# Patient Record
Sex: Female | Born: 1965 | Race: Black or African American | Hispanic: No | Marital: Single | Smoking: Former smoker
Health system: Southern US, Community
[De-identification: ages and names within clinical notes are randomized; demographics above are authoritative.]

## PROBLEM LIST (undated history)

## (undated) DIAGNOSIS — K5792 Diverticulitis of intestine, part unspecified, without perforation or abscess without bleeding: Secondary | ICD-10-CM

## (undated) DIAGNOSIS — M797 Fibromyalgia: Secondary | ICD-10-CM

## (undated) DIAGNOSIS — E079 Disorder of thyroid, unspecified: Secondary | ICD-10-CM

## (undated) DIAGNOSIS — Z21 Asymptomatic human immunodeficiency virus [HIV] infection status: Secondary | ICD-10-CM

## (undated) HISTORY — PX: OVARIAN CYST REMOVAL: SHX89

## (undated) HISTORY — PX: EYE SURGERY: SHX253

## (undated) HISTORY — PX: COLON SURGERY: SHX602

---

## 2012-09-07 DIAGNOSIS — B20 Human immunodeficiency virus [HIV] disease: Secondary | ICD-10-CM | POA: Diagnosis present

## 2013-03-12 DIAGNOSIS — E890 Postprocedural hypothyroidism: Secondary | ICD-10-CM | POA: Insufficient documentation

## 2013-03-12 DIAGNOSIS — Z87891 Personal history of nicotine dependence: Secondary | ICD-10-CM | POA: Insufficient documentation

## 2013-08-20 DIAGNOSIS — M7918 Myalgia, other site: Secondary | ICD-10-CM | POA: Insufficient documentation

## 2016-02-08 DIAGNOSIS — E039 Hypothyroidism, unspecified: Secondary | ICD-10-CM | POA: Insufficient documentation

## 2016-02-08 DIAGNOSIS — E785 Hyperlipidemia, unspecified: Secondary | ICD-10-CM | POA: Diagnosis present

## 2016-04-18 DIAGNOSIS — M797 Fibromyalgia: Secondary | ICD-10-CM | POA: Insufficient documentation

## 2018-09-21 DIAGNOSIS — F4310 Post-traumatic stress disorder, unspecified: Secondary | ICD-10-CM | POA: Insufficient documentation

## 2019-03-15 ENCOUNTER — Emergency Department (HOSPITAL_BASED_OUTPATIENT_CLINIC_OR_DEPARTMENT_OTHER)
Admission: EM | Admit: 2019-03-15 | Discharge: 2019-03-16 | Disposition: A | Discharge status: Home / Self Care | Payer: Medicare Other | Attending: Emergency Medicine | Admitting: Emergency Medicine

## 2019-03-15 ENCOUNTER — Other Ambulatory Visit: Payer: Self-pay

## 2019-03-15 DIAGNOSIS — Z79899 Other long term (current) drug therapy: Secondary | ICD-10-CM | POA: Diagnosis not present

## 2019-03-15 DIAGNOSIS — Y92012 Bathroom of single-family (private) house as the place of occurrence of the external cause: Secondary | ICD-10-CM | POA: Diagnosis not present

## 2019-03-15 DIAGNOSIS — Y9389 Activity, other specified: Secondary | ICD-10-CM | POA: Diagnosis not present

## 2019-03-15 DIAGNOSIS — Y998 Other external cause status: Secondary | ICD-10-CM | POA: Insufficient documentation

## 2019-03-15 DIAGNOSIS — W228XXA Striking against or struck by other objects, initial encounter: Secondary | ICD-10-CM | POA: Diagnosis not present

## 2019-03-15 DIAGNOSIS — S92512A Displaced fracture of proximal phalanx of left lesser toe(s), initial encounter for closed fracture: Secondary | ICD-10-CM | POA: Diagnosis not present

## 2019-03-15 DIAGNOSIS — S99922A Unspecified injury of left foot, initial encounter: Secondary | ICD-10-CM | POA: Diagnosis present

## 2019-03-15 HISTORY — DX: Diverticulitis of intestine, part unspecified, without perforation or abscess without bleeding: K57.92

## 2019-03-15 HISTORY — DX: Disorder of thyroid, unspecified: E07.9

## 2019-03-16 ENCOUNTER — Encounter (HOSPITAL_BASED_OUTPATIENT_CLINIC_OR_DEPARTMENT_OTHER): Payer: Self-pay | Admitting: *Deleted

## 2019-03-16 ENCOUNTER — Emergency Department (HOSPITAL_BASED_OUTPATIENT_CLINIC_OR_DEPARTMENT_OTHER): Payer: Medicare Other

## 2019-03-16 DIAGNOSIS — S92512A Displaced fracture of proximal phalanx of left lesser toe(s), initial encounter for closed fracture: Secondary | ICD-10-CM | POA: Diagnosis not present

## 2019-03-16 MED ORDER — DICLOFENAC SODIUM ER 100 MG PO TB24: 100.0000 mg | ORAL_TABLET | Freq: Every day | ORAL | 0 refills | Status: DC

## 2019-03-16 MED ORDER — IBUPROFEN 800 MG PO TABS
800.0000 mg | ORAL_TABLET | Freq: Once | ORAL | Status: AC
  Administered 2019-03-16: 800 mg via ORAL
  Filled 2019-03-16: qty 1

## 2019-03-16 MED ORDER — ACETAMINOPHEN 500 MG PO TABS
1000.0000 mg | ORAL_TABLET | Freq: Once | ORAL | Status: AC
  Administered 2019-03-16: 01:00:00 1000 mg via ORAL
  Filled 2019-03-16: qty 2

## 2019-03-16 NOTE — ED Notes (Signed)
Pt in XR. 

## 2019-03-16 NOTE — ED Triage Notes (Signed)
Pt reports she hit her left pinkie toe on the wall this evening and it bent the wrong way. Pt ambulatory with limp

## 2019-03-16 NOTE — ED Notes (Signed)
Patient transported to X-ray 

## 2019-03-16 NOTE — ED Provider Notes (Signed)
[facility] Provider Note   CSN: [id number] Arrival date & time: 03/15/19  2352     History Chief Complaint  Patient presents with  . Toe Injury    Stephanie Tyler is a 53 y.o. female.  The history is provided by the patient.  Foot Injury Location:  Toe Time since incident:  1 hour Injury: yes   Mechanism of injury comment:  Bumped left small toe on a knee wall in the bathroom  Toe location:  L little toe Pain details:    Quality:  Throbbing   Radiates to:  Does not radiate   Severity:  Severe   Onset quality:  Sudden   Duration:  1 hour   Timing:  Constant   Progression:  Unchanged Chronicity:  New Foreign body present:  No foreign bodies Prior injury to area: does not recall  Relieved by:  Nothing Worsened by:  Nothing Ineffective treatments:  None tried Associated symptoms: no back pain, no fever and no swelling   Risk factors: no concern for non-accidental trauma        Past Medical History:  Diagnosis Date  . Diverticulitis   . Thyroid disease     There are no problems to display for this patient.   Past Surgical History:  Procedure Laterality Date  . COLON SURGERY    . EYE SURGERY    . OVARIAN CYST REMOVAL       OB History   No obstetric history on file.     History reviewed. No pertinent family history.  Social History   Tobacco Use  . Smoking status: Former Research scientist (life sciences)  . Smokeless tobacco: Never Used  Substance Use Topics  . Alcohol use: Yes    Comment: rare  . Drug use: Not Currently    Home Medications Prior to Admission medications   Medication Sig Start Date End Date Taking? Authorizing Provider  levothyroxine (SYNTHROID) 100 MCG tablet Take 100 mcg by mouth daily before breakfast.   Yes [provider]  Diclofenac Sodium CR 100 MG 24 hr tablet Take 1 tablet (100 mg total) by mouth daily. 03/16/19   Cleto Claggett, MD    Allergies    Sulfamethoxazole and Sulfur  Review of Systems     Review of Systems  Constitutional: Negative for fever.  HENT: Negative for congestion.   Eyes: Negative for visual disturbance.  Respiratory: Negative for cough.   Cardiovascular: Negative for chest pain.  Gastrointestinal: Negative for abdominal pain.  Genitourinary: Negative for difficulty urinating.  Musculoskeletal: Positive for arthralgias. Negative for back pain.  Neurological: Negative for weakness.  Psychiatric/Behavioral: Negative for agitation.  All other systems reviewed and are negative.   Physical Exam Updated Vital Signs BP (!) 113/59 (BP Location: Right Arm)   Pulse (!) 57   Temp 98.2 F (36.8 C) (Oral)   Resp 19   Ht 5\' 1"  (1.549 m)   Wt 92.1 kg   SpO2 100%   BMI 38.36 kg/m   Physical Exam Vitals and nursing note reviewed.  Constitutional:      General: She is not in acute distress.    Appearance: Normal appearance.  HENT:     Head: Normocephalic and atraumatic.     Nose: Nose normal.  Eyes:     Conjunctiva/sclera: Conjunctivae normal.     Pupils: Pupils are equal, round, and reactive to light.  Cardiovascular:     Rate and Rhythm: Normal rate and regular rhythm.     Pulses: Normal  pulses.     Heart sounds: Normal heart sounds.  Pulmonary:     Effort: Pulmonary effort is normal.     Breath sounds: Normal breath sounds.  Abdominal:     General: Abdomen is flat.     Tenderness: There is no abdominal tenderness. There is no guarding.  Musculoskeletal:        General: Normal range of motion.     Cervical back: Normal range of motion and neck supple.  Skin:    General: Skin is warm and dry.     Capillary Refill: Capillary refill takes less than 2 seconds.  Neurological:     General: No focal deficit present.     Mental Status: She is alert and oriented to person, place, and time.  Psychiatric:        Mood and Affect: Mood normal.        Behavior: Behavior normal.     ED Results / Procedures / Treatments   Labs (all labs ordered are listed,  but only abnormal results are displayed) Labs Reviewed - No data to display  EKG None  Radiology DG Foot Complete Left  Result Date: 03/16/2019 CLINICAL DATA:  Fifth toe injury with pain, initial encounter EXAM: LEFT FOOT - COMPLETE 3+ VIEW COMPARISON:  None. FINDINGS: Prior fifth metatarsal fracture with healing is seen. There is a transverse mildly angulated fracture through the midportion of the fifth proximal phalanx with lateral angulation of the distal toe. No other focal abnormality is noted. IMPRESSION: Fifth proximal phalangeal fracture. Healed fifth metatarsal fracture. Electronically Signed   By: Alcide Clever M.D.   On: 03/16/2019 00:47    Procedures .Ortho Injury Treatment  Date/Time: 03/16/2019 5:11 AM Performed by: Cy Blamer, MD Authorized by: Cy Blamer, MD   Consent:    Consent obtained:  Verbal   Consent given by:  Patient   Risks discussed:  Fracture   Alternatives discussed:  No treatmentInjury location: toe Location details: left fifth toe Injury type: fracture Fracture type: proximal phalanx Pre-procedure neurovascular assessment: neurovascularly intact Pre-procedure distal perfusion: normal Pre-procedure neurological function: normal Pre-procedure range of motion: reduced  Anesthesia: Local anesthesia used: no  Patient sedated: NoManipulation performed: yes Skeletal traction used: yes Reduction successful: yes Immobilization: buddy taped and post op shoe applied. Post-procedure neurovascular assessment: post-procedure neurovascularly intact Post-procedure distal perfusion: normal Post-procedure neurological function: normal Post-procedure range of motion: normal Patient tolerance: patient tolerated the procedure well with no immediate complications    (including critical care time)  Medications Ordered in ED Medications  acetaminophen (TYLENOL) tablet 1,000 mg (1,000 mg Oral Given 03/16/19 0052)  ibuprofen (ADVIL) tablet 800 mg (800  mg Oral Given 03/16/19 0052)    ED Course  I have reviewed the triage vital signs and the nursing notes.  Pertinent labs & imaging results that were available during my care of the patient were reviewed by me and considered in my medical decision making (see chart for details).    Normal alignment post reduction, buddy taped and padded and placed in a post op shoe.  Ice elevation and NSAIDs.  Patient informed of old break in the foot.  She does not recall a previous injury.    Stephanie Tyler was evaluated in Emergency Department on 03/16/2019 for the symptoms described in the history of present illness. She was evaluated in the context of the global COVID-19 pandemic, which necessitated consideration that the patient might be at risk for infection with the SARS-CoV-2 virus that causes COVID-19. Institutional  protocols and algorithms that pertain to the evaluation of patients at risk for COVID-19 are in a state of rapid change based on information released by regulatory bodies including the CDC and federal and state organizations. These policies and algorithms were followed during the patient's care in the ED.   Final Clinical Impression(s) / ED Diagnoses Final diagnoses:  Closed displaced fracture of proximal phalanx of lesser toe of left foot, initial encounter   Return for intractable cough, coughing up blood,fevers >100.4 unrelieved by medication, shortness of breath, intractable vomiting, chest pain, shortness of breath, weakness,numbness, changes in speech, facial asymmetry,abdominal pain, passing out,Inability to tolerate liquids or food, cough, altered mental status or any concerns. No signs of systemic illness or infection. The patient is nontoxic-appearing on exam and vital signs are within normal limits.   I have reviewed the triage vital signs and the nursing notes. Pertinent labs &imaging results that were available during my care of the patient were reviewed by me and  considered in my medical decision making (see chart for details).  After history, exam, and medical workup I feel the patient has been appropriately medically screened and is safe for discharge home. Pertinent diagnoses were discussed with the patient. Patient was given return  Rx / DC Orders ED Discharge Orders         Ordered    Diclofenac Sodium CR 100 MG 24 hr tablet  Daily     03/16/19 0141           Nastashia Gallo, MD 03/16/19 [phone]

## 2019-05-28 DIAGNOSIS — H9042 Sensorineural hearing loss, unilateral, left ear, with unrestricted hearing on the contralateral side: Secondary | ICD-10-CM | POA: Insufficient documentation

## 2020-08-29 ENCOUNTER — Emergency Department (HOSPITAL_BASED_OUTPATIENT_CLINIC_OR_DEPARTMENT_OTHER)
Admission: EM | Admit: 2020-08-29 | Discharge: 2020-08-29 | Disposition: A | Discharge status: Home / Self Care | Payer: Medicare (Managed Care) | Attending: Emergency Medicine | Admitting: Emergency Medicine

## 2020-08-29 ENCOUNTER — Encounter (HOSPITAL_BASED_OUTPATIENT_CLINIC_OR_DEPARTMENT_OTHER): Payer: Self-pay | Admitting: Emergency Medicine

## 2020-08-29 ENCOUNTER — Other Ambulatory Visit: Payer: Self-pay

## 2020-08-29 DIAGNOSIS — Z20822 Contact with and (suspected) exposure to covid-19: Secondary | ICD-10-CM | POA: Diagnosis not present

## 2020-08-29 DIAGNOSIS — D72829 Elevated white blood cell count, unspecified: Secondary | ICD-10-CM | POA: Insufficient documentation

## 2020-08-29 DIAGNOSIS — Z87891 Personal history of nicotine dependence: Secondary | ICD-10-CM | POA: Diagnosis not present

## 2020-08-29 DIAGNOSIS — T383X5A Adverse effect of insulin and oral hypoglycemic [antidiabetic] drugs, initial encounter: Secondary | ICD-10-CM | POA: Diagnosis not present

## 2020-08-29 DIAGNOSIS — T50905A Adverse effect of unspecified drugs, medicaments and biological substances, initial encounter: Secondary | ICD-10-CM

## 2020-08-29 DIAGNOSIS — R1084 Generalized abdominal pain: Secondary | ICD-10-CM | POA: Diagnosis not present

## 2020-08-29 DIAGNOSIS — R112 Nausea with vomiting, unspecified: Secondary | ICD-10-CM | POA: Insufficient documentation

## 2020-08-29 DIAGNOSIS — R197 Diarrhea, unspecified: Secondary | ICD-10-CM | POA: Insufficient documentation

## 2020-08-29 LAB — COMPREHENSIVE METABOLIC PANEL
ALT: 18 U/L (ref 0–44)
AST: 24 U/L (ref 15–41)
Albumin: 4.6 g/dL (ref 3.5–5.0)
Alkaline Phosphatase: 92 U/L (ref 38–126)
Anion gap: 14 (ref 5–15)
BUN: 17 mg/dL (ref 6–20)
CO2: 25 mmol/L (ref 22–32)
Calcium: 10 mg/dL (ref 8.9–10.3)
Chloride: 100 mmol/L (ref 98–111)
Creatinine, Ser: 0.9 mg/dL (ref 0.44–1.00)
GFR, Estimated: >60 mL/min (ref >60)
Glucose, Bld: 113 mg/dL — ABNORMAL HIGH (ref 70–99)
Potassium: 3.8 mmol/L (ref 3.5–5.1)
Sodium: 139 mmol/L (ref 135–145)
Total Bilirubin: 0.5 mg/dL (ref 0.3–1.2)
Total Protein: 8.6 g/dL — ABNORMAL HIGH (ref 6.5–8.1)

## 2020-08-29 LAB — CBC
HCT: 47.7 % — ABNORMAL HIGH (ref 36.0–46.0)
Hemoglobin: 16.1 g/dL — ABNORMAL HIGH (ref 12.0–15.0)
MCH: 30.2 pg (ref 26.0–34.0)
MCHC: 33.8 g/dL (ref 30.0–36.0)
MCV: 89.5 fL (ref 80.0–100.0)
Platelets: 368 10*3/uL (ref 150–400)
RBC: 5.33 MIL/uL — ABNORMAL HIGH (ref 3.87–5.11)
RDW: 14.7 % (ref 11.5–15.5)
WBC: 15.4 10*3/uL — ABNORMAL HIGH (ref 4.0–10.5)
nRBC: 0 % (ref 0.0–0.2)

## 2020-08-29 LAB — URINALYSIS, ROUTINE W REFLEX MICROSCOPIC
Glucose, UA: NEGATIVE mg/dL
Ketones, ur: >80 mg/dL — AB
Leukocytes,Ua: NEGATIVE
Nitrite: NEGATIVE
Protein, ur: 30 mg/dL — AB
Specific Gravity, Urine: 1.025 (ref 1.005–1.030)
pH: 6.5 (ref 5.0–8.0)

## 2020-08-29 LAB — URINALYSIS, MICROSCOPIC (REFLEX)

## 2020-08-29 LAB — RESP PANEL BY RT-PCR (FLU A&B, COVID) ARPGX2
Influenza A by PCR: NEGATIVE
Influenza B by PCR: NEGATIVE
SARS Coronavirus 2 by RT PCR: NEGATIVE

## 2020-08-29 LAB — LIPASE, BLOOD: Lipase: 25 U/L (ref 11–51)

## 2020-08-29 MED ORDER — DICYCLOMINE HCL 20 MG PO TABS: 20.0000 mg | ORAL_TABLET | Freq: Two times a day (BID) | ORAL | 0 refills | Status: DC

## 2020-08-29 MED ORDER — SODIUM CHLORIDE 0.9 % IV BOLUS
1000.0000 mL | Freq: Once | INTRAVENOUS | Status: AC
  Administered 2020-08-29: 1000 mL via INTRAVENOUS

## 2020-08-29 MED ORDER — ONDANSETRON HCL 4 MG/2ML IJ SOLN
4.0000 mg | Freq: Once | INTRAMUSCULAR | Status: AC | PRN
  Administered 2020-08-29: 4 mg via INTRAVENOUS
  Filled 2020-08-29: qty 2

## 2020-08-29 MED ORDER — ONDANSETRON 4 MG PO TBDP: 4.0000 mg | ORAL_TABLET | Freq: Three times a day (TID) | ORAL | 0 refills | Status: AC | PRN

## 2020-08-29 NOTE — ED Provider Notes (Signed)
[facility] EMERGENCY DEPARTMENT Provider Note   CSN: [id number] Arrival date & time: 08/29/20  1541     History Chief Complaint  Patient presents with  . Vomiting    Stephanie Tyler is a 55 y.o. female who presents to the ED today with complaint of nausea, NBNB emesis, watery diarrhea, and abdominal pain that began Friday (2 days ago). Pt states that she received Ozempic injection Friday by PCP for weight loss/peripheral edema. She reports she went home and began eating grapes and shortly afterwards began vomiting. She has been unable to keep anything down since then. She began having watery diarrhea today causing concern. She also complains of her arms and legs hurting. She denies fevers or chills. No recent sick contacts. She is vaccinated for COVID.   The history is provided by the patient and medical records.       Past Medical History:  Diagnosis Date  . Diverticulitis   . Thyroid disease     There are no problems to display for this patient.   Past Surgical History:  Procedure Laterality Date  . COLON SURGERY    . EYE SURGERY    . OVARIAN CYST REMOVAL       OB History   No obstetric history on file.     No family history on file.  Social History   Tobacco Use  . Smoking status: Former Games developer  . Smokeless tobacco: Never Used  Vaping Use  . Vaping Use: Never used  Substance Use Topics  . Alcohol use: Yes    Comment: rare  . Drug use: Not Currently    Home Medications Prior to Admission medications   Medication Sig Start Date End Date Taking? Authorizing Provider  dicyclomine (BENTYL) 20 MG tablet Take 1 tablet (20 mg total) by mouth 2 (two) times daily. 08/29/20  Yes Gotti Alwin, PA-C  ondansetron (ZOFRAN ODT) 4 MG disintegrating tablet Take 1 tablet (4 mg total) by mouth every 8 (eight) hours as needed for nausea or vomiting. 08/29/20  Yes Hyman Hopes, Kailani Brass, PA-C  Diclofenac Sodium CR 100 MG 24 hr tablet Take 1 tablet (100 mg total) by  mouth daily. 03/16/19   Palumbo, April, MD  levothyroxine (SYNTHROID) 100 MCG tablet Take 100 mcg by mouth daily before breakfast.    [provider]    Allergies    Elemental sulfur and Sulfamethoxazole  Review of Systems   Review of Systems  Constitutional: Positive for fatigue. Negative for chills and fever.  Gastrointestinal: Positive for abdominal pain, diarrhea, nausea and vomiting.  Musculoskeletal: Positive for myalgias.  All other systems reviewed and are negative.   Physical Exam Updated Vital Signs BP (!) 187/94 (BP Location: Left Arm)   Pulse 77   Temp 98.6 F (37 C) (Oral)   Resp 18   Wt 92.1 kg   SpO2 100%   BMI 38.36 kg/m   Physical Exam Vitals and nursing note reviewed.  Constitutional:      Appearance: She is not ill-appearing or diaphoretic.  HENT:     Head: Normocephalic and atraumatic.     Mouth/Throat:     Mouth: Mucous membranes are dry.  Eyes:     Conjunctiva/sclera: Conjunctivae normal.  Cardiovascular:     Rate and Rhythm: Normal rate and regular rhythm.     Pulses: Normal pulses.  Pulmonary:     Effort: Pulmonary effort is normal.     Breath sounds: Normal breath sounds. No wheezing, rhonchi or rales.  Abdominal:  Palpations: Abdomen is soft.     Tenderness: There is abdominal tenderness. There is no guarding or rebound.  Musculoskeletal:     Cervical back: Neck supple.  Skin:    General: Skin is warm and dry.     Comments: Decreased skin turgor  Neurological:     Mental Status: She is alert.     ED Results / Procedures / Treatments   Labs (all labs ordered are listed, but only abnormal results are displayed) Labs Reviewed  COMPREHENSIVE METABOLIC PANEL - Abnormal; Notable for the following components:      Result Value   Glucose, Bld 113 (*)    Total Protein 8.6 (*)    All other components within normal limits  CBC - Abnormal; Notable for the following components:   WBC 15.4 (*)    RBC 5.33 (*)    Hemoglobin  16.1 (*)    HCT 47.7 (*)    All other components within normal limits  URINALYSIS, ROUTINE W REFLEX MICROSCOPIC - Abnormal; Notable for the following components:   APPearance HAZY (*)    Hgb urine dipstick SMALL (*)    Bilirubin Urine MODERATE (*)    Ketones, ur >80 (*)    Protein, ur 30 (*)    All other components within normal limits  URINALYSIS, MICROSCOPIC (REFLEX) - Abnormal; Notable for the following components:   Bacteria, UA FEW (*)    All other components within normal limits  RESP PANEL BY RT-PCR (FLU A&B, COVID) ARPGX2  LIPASE, BLOOD    EKG None  Radiology No results found.  Procedures Procedures   Medications Ordered in ED Medications  ondansetron (ZOFRAN) injection 4 mg (4 mg Intravenous Given 08/29/20 1606)  sodium chloride 0.9 % bolus 1,000 mL (0 mLs Intravenous Stopped 08/29/20 1805)    ED Course  I have reviewed the triage vital signs and the nursing notes.  Pertinent labs & imaging results that were available during my care of the patient were reviewed by me and considered in my medical decision making (see chart for details).    MDM Rules/Calculators/A&P                          55 year old female presenting to the ED with complaint of N/V/D, abdominal pain, and body aches for the past 2 days. She started taking Ozempic Friday for weight loss and feels like she is having an adverse rxn. On arrival to the ED VSS. Blood pressure elevated 187/94. No previous documented hx of HTN. On exam she has dry MM and has diffuse abdominal TTP without rebound or guarding; doubt acute abdomen at this time. Will provide fluids, antiemetics, and obtain labs. Will also swab for COVID given GI complaints with body aches. Per UTD myalgias do not appear to be a common side effect.   CBC with leukocytosis 15,400 however RBC, HGB, and HCT all elevated as well; suspect elevated s/2 dehydration.  CMP with glucose 113. No other electrolyte abnormalities. Creatinine 0.90, GFR 60. No  LFT elevations Lipase 25.  U/A with small hgb on urine dipstick however no RBC on HPF. Moderate bilirubin, > 80 ketones, and 30 protein consistent with dehydration COVID and flu negative  On reevaluation pt resting more comfortably. She reports improvement in her symptoms and wants to try to drink gingerale; will fluid challenge at this time and if able to tolerate PO will discharge home.   Pt able to tolerate gingerale without difficulty.  Will discharge home at this time. She is advised to follow up with her PCP on Tuesday regarding ED visit and to discontinue use of ozempic. Pt in agreement with plan and stable for discharge ome.   This note was prepared using Dragon voice recognition software and may include unintentional dictation errors due to the inherent limitations of voice recognition software.  Final Clinical Impression(s) / ED Diagnoses Final diagnoses:  Generalized abdominal pain  Nausea vomiting and diarrhea  Adverse effect of drug, initial encounter    Rx / DC Orders ED Discharge Orders         Ordered    ondansetron (ZOFRAN ODT) 4 MG disintegrating tablet  Every 8 hours PRN        08/29/20 1845    dicyclomine (BENTYL) 20 MG tablet  2 times daily        08/29/20 1845           Discharge Instructions     Please pick up medications and take as needed for symptomatic relief. Drink plenty of fluids to stay hydrated.   Follow up with your PCP on Tuesday regarding your ED visit. It is recommended that you discontinue the ozempic.   Return to the ED IMMEDIATELY for any new/worsening symptoms       Tanda Rockers, PA-C 08/29/20 1849    Virgina Norfolk, DO 08/29/20 1946

## 2020-08-29 NOTE — ED Triage Notes (Signed)
Reports she was started on ozempic on Friday.  Having N/V/D since also reports having body cramping.

## 2020-08-29 NOTE — ED Notes (Signed)
Pt tolerating ginger ale without nausea or vomiting. States feels much better

## 2020-08-29 NOTE — ED Notes (Signed)
Pt provided with gingerale for PO challenge  

## 2020-08-29 NOTE — Discharge Instructions (Signed)
Please pick up medications and take as needed for symptomatic relief. Drink plenty of fluids to stay hydrated.   Follow up with your PCP on Tuesday regarding your ED visit. It is recommended that you discontinue the ozempic.   Return to the ED IMMEDIATELY for any new/worsening symptoms

## 2020-12-09 DIAGNOSIS — F1290 Cannabis use, unspecified, uncomplicated: Secondary | ICD-10-CM | POA: Insufficient documentation

## 2020-12-17 DIAGNOSIS — G4733 Obstructive sleep apnea (adult) (pediatric): Secondary | ICD-10-CM

## 2021-01-30 ENCOUNTER — Other Ambulatory Visit: Payer: Self-pay

## 2021-01-30 ENCOUNTER — Emergency Department (HOSPITAL_BASED_OUTPATIENT_CLINIC_OR_DEPARTMENT_OTHER): Payer: Medicare (Managed Care)

## 2021-01-30 ENCOUNTER — Emergency Department (HOSPITAL_BASED_OUTPATIENT_CLINIC_OR_DEPARTMENT_OTHER)
Admission: EM | Admit: 2021-01-30 | Discharge: 2021-01-30 | Disposition: A | Discharge status: Home / Self Care | Payer: Medicare (Managed Care) | Attending: Emergency Medicine | Admitting: Emergency Medicine

## 2021-01-30 ENCOUNTER — Encounter (HOSPITAL_BASED_OUTPATIENT_CLINIC_OR_DEPARTMENT_OTHER): Payer: Self-pay | Admitting: *Deleted

## 2021-01-30 DIAGNOSIS — S93402A Sprain of unspecified ligament of left ankle, initial encounter: Secondary | ICD-10-CM | POA: Diagnosis not present

## 2021-01-30 DIAGNOSIS — Y92410 Unspecified street and highway as the place of occurrence of the external cause: Secondary | ICD-10-CM | POA: Diagnosis not present

## 2021-01-30 DIAGNOSIS — X501XXA Overexertion from prolonged static or awkward postures, initial encounter: Secondary | ICD-10-CM | POA: Insufficient documentation

## 2021-01-30 DIAGNOSIS — M25572 Pain in left ankle and joints of left foot: Secondary | ICD-10-CM | POA: Diagnosis not present

## 2021-01-30 DIAGNOSIS — Z87891 Personal history of nicotine dependence: Secondary | ICD-10-CM | POA: Diagnosis not present

## 2021-01-30 DIAGNOSIS — S99912A Unspecified injury of left ankle, initial encounter: Secondary | ICD-10-CM | POA: Diagnosis present

## 2021-01-30 MED ORDER — OXYCODONE-ACETAMINOPHEN 5-325 MG PO TABS
1.0000 | ORAL_TABLET | Freq: Once | ORAL | Status: AC
  Administered 2021-01-30: 1 via ORAL
  Filled 2021-01-30: qty 1

## 2021-01-30 NOTE — ED Triage Notes (Signed)
Pt reports twisted ankle 2 days ago walking on uneven ground

## 2021-01-30 NOTE — Discharge Instructions (Signed)
Follow-up with orthopedic or sports medicine next week.  Recommend bearing weight as tolerated.  Take Tylenol and Motrin as needed for pain control.

## 2021-01-30 NOTE — ED Provider Notes (Signed)
[facility] EMERGENCY DEPARTMENT Provider Note   CSN: [id number] Arrival date & time: 01/30/21  1522     History Chief Complaint  Patient presents with   Foot Pain    Donzella Carrol is a 55 y.o. female.  Presented to ER with concern for foot and ankle pain.  Patient reports on Friday she twisted her left ankle while going over some asphalt.  States that she has felt a sudden amount of pain, since  her to the ground.  Denies any other major trauma.  Pain is aching, throbbing, worse with movement, improved with rest.  No numbness, weakness, noted some swelling but no redness or ecchymosis.  Has been able to bear weight.  With some difficulty  HPI     Past Medical History:  Diagnosis Date   Diverticulitis    Thyroid disease     There are no problems to display for this patient.   Past Surgical History:  Procedure Laterality Date   COLON SURGERY     EYE SURGERY     OVARIAN CYST REMOVAL       OB History   No obstetric history on file.     No family history on file.  Social History   Tobacco Use   Smoking status: Former   Smokeless tobacco: Never  Building services engineer Use: Never used  Substance Use Topics   Alcohol use: Yes    Comment: rare   Drug use: Not Currently    Home Medications Prior to Admission medications   Medication Sig Start Date End Date Taking? Authorizing Provider  Diclofenac Sodium CR 100 MG 24 hr tablet Take 1 tablet (100 mg total) by mouth daily. 03/16/19   Palumbo, April, MD  dicyclomine (BENTYL) 20 MG tablet Take 1 tablet (20 mg total) by mouth 2 (two) times daily. 08/29/20   Tanda Rockers, PA-C  levothyroxine (SYNTHROID) 100 MCG tablet Take 100 mcg by mouth daily before breakfast.    [provider]  ondansetron (ZOFRAN ODT) 4 MG disintegrating tablet Take 1 tablet (4 mg total) by mouth every 8 (eight) hours as needed for nausea or vomiting. 08/29/20   Tanda Rockers, PA-C    Allergies    Elemental sulfur and  Sulfamethoxazole  Review of Systems   Review of Systems  Musculoskeletal:  Positive for arthralgias.  All other systems reviewed and are negative.  Physical Exam Updated Vital Signs BP (!) 155/99 (BP Location: Left Arm)   Pulse 75   Temp 98.8 F (37.1 C) (Oral)   Resp 18   SpO2 91%   Physical Exam Vitals and nursing note reviewed.  Constitutional:      General: She is not in acute distress.    Appearance: She is well-developed.  HENT:     Head: Normocephalic and atraumatic.  Eyes:     Conjunctiva/sclera: Conjunctivae normal.  Cardiovascular:     Rate and Rhythm: Normal rate.     Pulses: Normal pulses.  Pulmonary:     Effort: Pulmonary effort is normal. No respiratory distress.  Musculoskeletal:     Cervical back: Neck supple.     Comments: Left lower extremity: There is tenderness to palpation in the left ankle, hindfoot, no significant deformity appreciated; normal sensation, normal DP/PT pulse  Skin:    General: Skin is warm and dry.  Neurological:     General: No focal deficit present.     Mental Status: She is alert.  Psychiatric:  Mood and Affect: Mood normal.    ED Results / Procedures / Treatments   Labs (all labs ordered are listed, but only abnormal results are displayed) Labs Reviewed - No data to display  EKG None  Radiology DG Ankle Complete Left  Result Date: 01/30/2021 CLINICAL DATA:  Twisted foot and ankle walking outside 2 days ago. EXAM: LEFT ANKLE COMPLETE - 3+ VIEW COMPARISON:  None. FINDINGS: No fracture. No dislocation. Normal mortise and ankle alignment. May be a small ankle joint effusion. Intact talar dome. There is a small plantar calcaneal spur and Achilles tendon enthesophyte. IMPRESSION: No acute fracture or subluxation. Possible small joint effusion. Electronically Signed   By: Narda Rutherford M.D.   On: 01/30/2021 16:25   DG Foot Complete Left  Result Date: 01/30/2021 CLINICAL DATA:  Twisted foot and ankle walking outside  2 days ago. EXAM: LEFT FOOT - COMPLETE 3+ VIEW COMPARISON:  Foot radiograph 03/16/2019 FINDINGS: No acute fracture. Remote healed fracture of the fifth metatarsal shaft and fifth proximal phalanx. Normal foot alignment. Trace spurring of the first metatarsal phalangeal joint and dorsal midfoot. Small plantar calcaneal spur and Achilles tendon enthesophyte. No focal soft tissue abnormality. IMPRESSION: 1. No acute fracture or subluxation of the left foot. 2. Remote healed fifth metatarsal and proximal phalanx fractures. Electronically Signed   By: Narda Rutherford M.D.   On: 01/30/2021 16:26    Procedures Procedures   Medications Ordered in ED Medications  oxyCODONE-acetaminophen (PERCOCET/ROXICET) 5-325 MG per tablet 1 tablet (1 tablet Oral Given 01/30/21 1712)    ED Course  I have reviewed the triage vital signs and the nursing notes.  Pertinent labs & imaging results that were available during my care of the patient were reviewed by me and considered in my medical decision making (see chart for details).    MDM Rules/Calculators/A&P                           55 year old lady presents to ER with concern for left ankle injury.  On exam noted some tenderness to the ankle but no significant deformity.  Neurovascular intact.  Plain films negative.  Recommend supportive care, follow-up with Ortho or sports medicine as needed if symptoms not improving.  After the discussed management above, the patient was determined to be safe for discharge.  The patient was in agreement with this plan and all questions regarding their care were answered.  ED return precautions were discussed and the patient will return to the ED with any significant worsening of condition.  Final Clinical Impression(s) / ED Diagnoses Final diagnoses:  Sprain of left ankle, unspecified ligament, initial encounter    Rx / DC Orders ED Discharge Orders     None        Milagros Loll, MD 01/30/21 [phone]

## 2021-03-11 ENCOUNTER — Other Ambulatory Visit (HOSPITAL_BASED_OUTPATIENT_CLINIC_OR_DEPARTMENT_OTHER): Payer: Self-pay

## 2021-03-11 MED ORDER — OZEMPIC (0.25 OR 0.5 MG/DOSE) 2 MG/1.5ML ~~LOC~~ SOPN
PEN_INJECTOR | SUBCUTANEOUS | 3 refills | Status: DC
  Filled 2021-03-11: qty 1.5, 28d supply, fill #0
  Filled 2021-04-08: qty 4.5, 84d supply, fill #1

## 2021-03-29 ENCOUNTER — Other Ambulatory Visit (HOSPITAL_COMMUNITY): Payer: Self-pay

## 2021-04-08 ENCOUNTER — Other Ambulatory Visit (HOSPITAL_BASED_OUTPATIENT_CLINIC_OR_DEPARTMENT_OTHER): Payer: Self-pay

## 2021-05-26 ENCOUNTER — Other Ambulatory Visit (HOSPITAL_COMMUNITY): Payer: Self-pay

## 2021-05-31 ENCOUNTER — Other Ambulatory Visit (HOSPITAL_COMMUNITY): Payer: Self-pay

## 2021-08-09 ENCOUNTER — Other Ambulatory Visit (HOSPITAL_BASED_OUTPATIENT_CLINIC_OR_DEPARTMENT_OTHER): Payer: Self-pay

## 2021-08-09 MED ORDER — OZEMPIC (1 MG/DOSE) 4 MG/3ML ~~LOC~~ SOPN
1.0000 mg | PEN_INJECTOR | SUBCUTANEOUS | 2 refills | Status: DC
  Filled 2021-08-09: qty 3, 28d supply, fill #0

## 2021-08-17 ENCOUNTER — Other Ambulatory Visit (HOSPITAL_BASED_OUTPATIENT_CLINIC_OR_DEPARTMENT_OTHER): Payer: Self-pay

## 2021-10-19 DIAGNOSIS — M5412 Radiculopathy, cervical region: Secondary | ICD-10-CM | POA: Insufficient documentation

## 2023-01-31 DIAGNOSIS — L439 Lichen planus, unspecified: Secondary | ICD-10-CM | POA: Insufficient documentation

## 2023-03-23 ENCOUNTER — Encounter (HOSPITAL_BASED_OUTPATIENT_CLINIC_OR_DEPARTMENT_OTHER): Payer: Self-pay

## 2023-03-23 ENCOUNTER — Emergency Department (HOSPITAL_COMMUNITY): Payer: 59

## 2023-03-23 ENCOUNTER — Observation Stay (HOSPITAL_BASED_OUTPATIENT_CLINIC_OR_DEPARTMENT_OTHER)
Admission: EM | Admit: 2023-03-23 | Discharge: 2023-03-25 | Disposition: A | Discharge status: Home / Self Care | Payer: 59 | Attending: Student in an Organized Health Care Education/Training Program | Admitting: Student in an Organized Health Care Education/Training Program

## 2023-03-23 ENCOUNTER — Emergency Department (HOSPITAL_BASED_OUTPATIENT_CLINIC_OR_DEPARTMENT_OTHER): Payer: 59

## 2023-03-23 ENCOUNTER — Other Ambulatory Visit: Payer: Self-pay

## 2023-03-23 DIAGNOSIS — Z7901 Long term (current) use of anticoagulants: Secondary | ICD-10-CM | POA: Insufficient documentation

## 2023-03-23 DIAGNOSIS — L432 Lichenoid drug reaction: Secondary | ICD-10-CM | POA: Diagnosis not present

## 2023-03-23 DIAGNOSIS — I5020 Unspecified systolic (congestive) heart failure: Secondary | ICD-10-CM | POA: Insufficient documentation

## 2023-03-23 DIAGNOSIS — I10 Essential (primary) hypertension: Secondary | ICD-10-CM | POA: Diagnosis present

## 2023-03-23 DIAGNOSIS — G4733 Obstructive sleep apnea (adult) (pediatric): Secondary | ICD-10-CM | POA: Insufficient documentation

## 2023-03-23 DIAGNOSIS — Z79899 Other long term (current) drug therapy: Secondary | ICD-10-CM | POA: Diagnosis not present

## 2023-03-23 DIAGNOSIS — E039 Hypothyroidism, unspecified: Secondary | ICD-10-CM | POA: Diagnosis not present

## 2023-03-23 DIAGNOSIS — I110 Hypertensive heart disease with heart failure: Secondary | ICD-10-CM | POA: Diagnosis not present

## 2023-03-23 DIAGNOSIS — J45998 Other asthma: Secondary | ICD-10-CM | POA: Insufficient documentation

## 2023-03-23 DIAGNOSIS — E785 Hyperlipidemia, unspecified: Secondary | ICD-10-CM | POA: Diagnosis not present

## 2023-03-23 DIAGNOSIS — R29898 Other symptoms and signs involving the musculoskeletal system: Principal | ICD-10-CM

## 2023-03-23 DIAGNOSIS — Z8739 Personal history of other diseases of the musculoskeletal system and connective tissue: Secondary | ICD-10-CM | POA: Diagnosis not present

## 2023-03-23 DIAGNOSIS — I63311 Cerebral infarction due to thrombosis of right middle cerebral artery: Secondary | ICD-10-CM | POA: Diagnosis not present

## 2023-03-23 DIAGNOSIS — R9431 Abnormal electrocardiogram [ECG] [EKG]: Secondary | ICD-10-CM | POA: Insufficient documentation

## 2023-03-23 DIAGNOSIS — I639 Cerebral infarction, unspecified: Secondary | ICD-10-CM | POA: Diagnosis present

## 2023-03-23 DIAGNOSIS — B20 Human immunodeficiency virus [HIV] disease: Secondary | ICD-10-CM | POA: Diagnosis not present

## 2023-03-23 DIAGNOSIS — R531 Weakness: Secondary | ICD-10-CM | POA: Diagnosis present

## 2023-03-23 DIAGNOSIS — Z87891 Personal history of nicotine dependence: Secondary | ICD-10-CM | POA: Insufficient documentation

## 2023-03-23 HISTORY — DX: Asymptomatic human immunodeficiency virus (hiv) infection status: Z21

## 2023-03-23 HISTORY — DX: Fibromyalgia: M79.7

## 2023-03-23 LAB — DIFFERENTIAL
Abs Immature Granulocytes: 0.03 10*3/uL (ref 0.00–0.07)
Basophils Absolute: 0 10*3/uL (ref 0.0–0.1)
Basophils Relative: 1 %
Eosinophils Absolute: 0.2 10*3/uL (ref 0.0–0.5)
Eosinophils Relative: 3 %
Immature Granulocytes: 0 %
Lymphocytes Relative: 27 %
Lymphs Abs: 2.2 10*3/uL (ref 0.7–4.0)
Monocytes Absolute: 0.3 10*3/uL (ref 0.1–1.0)
Monocytes Relative: 4 %
Neutro Abs: 5.3 10*3/uL (ref 1.7–7.7)
Neutrophils Relative %: 65 %

## 2023-03-23 LAB — CBC
HCT: 41.7 % (ref 36.0–46.0)
Hemoglobin: 14.2 g/dL (ref 12.0–15.0)
MCH: 31.7 pg (ref 26.0–34.0)
MCHC: 34.1 g/dL (ref 30.0–36.0)
MCV: 93.1 fL (ref 80.0–100.0)
Platelets: 316 10*3/uL (ref 150–400)
RBC: 4.48 MIL/uL (ref 3.87–5.11)
RDW: 14.2 % (ref 11.5–15.5)
WBC: 8.1 10*3/uL (ref 4.0–10.5)
nRBC: 0 % (ref 0.0–0.2)

## 2023-03-23 LAB — COMPREHENSIVE METABOLIC PANEL
ALT: 20 U/L (ref 0–44)
AST: 25 U/L (ref 15–41)
Albumin: 4 g/dL (ref 3.5–5.0)
Alkaline Phosphatase: 72 U/L (ref 38–126)
Anion gap: 7 (ref 5–15)
BUN: 8 mg/dL (ref 6–20)
CO2: 24 mmol/L (ref 22–32)
Calcium: 9.3 mg/dL (ref 8.9–10.3)
Chloride: 106 mmol/L (ref 98–111)
Creatinine, Ser: 0.83 mg/dL (ref 0.44–1.00)
GFR, Estimated: >60 mL/min (ref >60)
Glucose, Bld: 121 mg/dL — ABNORMAL HIGH (ref 70–99)
Potassium: 3.6 mmol/L (ref 3.5–5.1)
Sodium: 137 mmol/L (ref 135–145)
Total Bilirubin: 0.8 mg/dL (ref <1.2)
Total Protein: 7.5 g/dL (ref 6.5–8.1)

## 2023-03-23 LAB — PROTIME-INR
INR: 0.9 (ref 0.8–1.2)
Prothrombin Time: 12.5 s (ref 11.4–15.2)

## 2023-03-23 LAB — ETHANOL: Alcohol, Ethyl (B): <10 mg/dL (ref <10)

## 2023-03-23 LAB — CBG MONITORING, ED: Glucose-Capillary: 127 mg/dL — ABNORMAL HIGH (ref 70–99)

## 2023-03-23 LAB — APTT: aPTT: 25 s (ref 24–36)

## 2023-03-23 MED ORDER — ACETAMINOPHEN 325 MG PO TABS
650.0000 mg | ORAL_TABLET | Freq: Four times a day (QID) | ORAL | Status: DC | PRN
  Administered 2023-03-23 – 2023-03-24 (×2): 650 mg via ORAL
  Filled 2023-03-23 (×2): qty 2

## 2023-03-23 MED ORDER — LEVOTHYROXINE SODIUM 100 MCG PO TABS
100.0000 ug | ORAL_TABLET | Freq: Every day | ORAL | Status: DC
  Administered 2023-03-24 – 2023-03-25 (×2): 100 ug via ORAL
  Filled 2023-03-23 (×2): qty 1

## 2023-03-23 MED ORDER — SENNOSIDES-DOCUSATE SODIUM 8.6-50 MG PO TABS: 1.0000 | ORAL_TABLET | Freq: Every evening | ORAL | Status: DC | PRN

## 2023-03-23 MED ORDER — SODIUM CHLORIDE 0.9% FLUSH
3.0000 mL | Freq: Once | INTRAVENOUS | Status: DC
  Filled 2023-03-23: qty 3

## 2023-03-23 MED ORDER — LORAZEPAM 2 MG/ML IJ SOLN: 1.0000 mg | Freq: Once | INTRAMUSCULAR | Status: DC | PRN

## 2023-03-23 MED ORDER — ENOXAPARIN SODIUM 40 MG/0.4ML IJ SOSY
40.0000 mg | PREFILLED_SYRINGE | Freq: Every day | INTRAMUSCULAR | Status: DC
  Administered 2023-03-23: 40 mg via SUBCUTANEOUS
  Filled 2023-03-23: qty 0.4

## 2023-03-23 MED ORDER — IOHEXOL 350 MG/ML SOLN
75.0000 mL | Freq: Once | INTRAVENOUS | Status: AC | PRN
  Administered 2023-03-23: 75 mL via INTRAVENOUS

## 2023-03-23 MED ORDER — ACETAMINOPHEN 650 MG RE SUPP: 650.0000 mg | Freq: Four times a day (QID) | RECTAL | Status: DC | PRN

## 2023-03-23 NOTE — Discharge Instructions (Addendum)
To [name] or their caretakers,  They were admitted to [facility] on 03/23/2023 for evaluation and treatment of:  Active Problems:   HLD (hyperlipidemia)   Human immunodeficiency virus (HIV) disease (HCC)   OSA on CPAP   Essential hypertension   HFrEF (heart failure with reduced ejection fraction) (HCC)  Principal Problem (Resolved):   Acute cerebral infarction [facility])  The evaluation suggested stroke on the right side of the brain causing weakness and numbness on the left side of the body. They were treated supportive care and medicines to reduce the risk of another stroke.  The results of a heart scan also showed reduced heart pumping function.  They were discharged from the hospital on 03/25/23. I recommend the following after leaving the hospital:   Active Problems:   HLD (hyperlipidemia)   Human immunodeficiency virus (HIV) disease (HCC)   OSA on CPAP   Essential hypertension   HFrEF (heart failure with reduced ejection fraction) (HCC)  Principal Problem (Resolved):   Acute cerebral infarction (HCC)  To reduce her risk of another stroke, start taking aspirin daily, indefinitely.  Start taking Plavix daily.  You can stop this on January 11.  Start taking rosuvastatin daily.  You can stop pravastatin.  To improve your heart health, increase lisinopril to 30 mg daily.  Start taking metoprolol, 1/2 tablet, once a day at bedtime. Talk to your dermatologist about starting Entresto, which is similar to losartan.  Follow-up with your primary doctor soon as possible after leaving the hospital.  You should see a cardiologist for your heart function.  You should follow-up with a neurologist as well.  For questions about your care plan, until you are able to see your primary doctor: Call [phone]. Dial 0 for the operator. Ask for the internal medicine resident on call.  Marrianne Mood MD 03/25/2023, 12:51 PM

## 2023-03-23 NOTE — ED Provider Notes (Signed)
[place] EMERGENCY DEPARTMENT AT [facility] Provider Note   CSN: [id number] Arrival date & time: 03/23/23  1604     History  Chief Complaint  Patient presents with   Numbness    Stephanie Tyler is a 57 y.o. female history of HIV that is well-controlled, hypothyroidism, here presenting with numbness and weakness.  Patient states that around 6 PM yesterday, she had an onset of left arm numbness and weakness.  She also had trouble walking as well.  Patient also noticed that whenever she picks up anything with the left arm she would drop it.  She thought it would go away after she took a nap and this morning and got worse.  Patient also has intermittent slurred speech as well.  Due to persistent symptoms, patient came to the ER for further evaluation.  No history of stroke in the past.  Denies any history of diabetes.  HPI     Home Medications Prior to Admission medications   Medication Sig Start Date End Date Taking? Authorizing Provider  Diclofenac Sodium CR 100 MG 24 hr tablet Take 1 tablet (100 mg total) by mouth daily. 03/16/19   Palumbo, April, MD  dicyclomine (BENTYL) 20 MG tablet Take 1 tablet (20 mg total) by mouth 2 (two) times daily. 08/29/20   Tanda Rockers, PA-C  levothyroxine (SYNTHROID) 100 MCG tablet Take 100 mcg by mouth daily before breakfast.    [provider]  ondansetron (ZOFRAN ODT) 4 MG disintegrating tablet Take 1 tablet (4 mg total) by mouth every 8 (eight) hours as needed for nausea or vomiting. 08/29/20   Hyman Hopes, Margaux, PA-C  Semaglutide, 1 MG/DOSE, (OZEMPIC, 1 MG/DOSE,) 4 MG/3ML SOPN Inject 1 mg into the skin once a week. 08/09/21     Semaglutide,0.25 or 0.5MG /DOS, (OZEMPIC, 0.25 OR 0.5 MG/DOSE,) 2 MG/1.5ML SOPN Inject 0.5 mg into the skin once a week. 03/11/21         Allergies    Elemental sulfur and Sulfamethoxazole    Review of Systems   Review of Systems  Neurological:  Positive for speech difficulty and weakness.  All other  systems reviewed and are negative.   Physical Exam Updated Vital Signs BP (!) 147/93 (BP Location: Right Arm)   Pulse 78   Temp 98.9 F (37.2 C) (Oral)   Resp 16   Wt 94.3 kg   SpO2 96%   BMI 39.30 kg/m  Physical Exam Vitals and nursing note reviewed.  Constitutional:      Appearance: Normal appearance.  HENT:     Head: Normocephalic.     Nose: Nose normal.     Mouth/Throat:     Mouth: Mucous membranes are moist.  Eyes:     Extraocular Movements: Extraocular movements intact.     Pupils: Pupils are equal, round, and reactive to light.  Cardiovascular:     Rate and Rhythm: Normal rate and regular rhythm.     Pulses: Normal pulses.     Heart sounds: Normal heart sounds.  Pulmonary:     Effort: Pulmonary effort is normal.     Breath sounds: Normal breath sounds.  Abdominal:     General: Abdomen is flat.     Palpations: Abdomen is soft.  Musculoskeletal:        General: Normal range of motion.     Cervical back: Normal range of motion and neck supple.  Skin:    General: Skin is warm.  Neurological:     Mental Status: She is alert.  Comments: Mild left facial droop.  Patient does not have any visual field defect.  Strength is 4 out of 5 left arm but 5 out of 5 bilateral legs and right arm.  Patient has slightly abnormal finger-to-nose on the left side.  Patient has slightly wide-based gait but able to ambulate by herself.  Patient has normal sensation bilateral arms and legs  Psychiatric:        Mood and Affect: Mood normal.        Behavior: Behavior normal.     ED Results / Procedures / Treatments   Labs (all labs ordered are listed, but only abnormal results are displayed) Labs Reviewed  CBG MONITORING, ED - Abnormal; Notable for the following components:      Result Value   Glucose-Capillary 127 (*)    All other components within normal limits  PROTIME-INR  APTT  CBC  DIFFERENTIAL  COMPREHENSIVE METABOLIC PANEL  ETHANOL    EKG None  Radiology No  results found.  Procedures Procedures    Medications Ordered in ED Medications  sodium chloride flush (NS) 0.9 % injection 3 mL (has no administration in time range)    ED Course/ Medical Decision Making/ A&P                                 Medical Decision Making Stephanie Tyler is a 58 y.o. female here presenting with left-sided weakness.  Symptoms going on for about 24 hours now.  Patient is weaker on the left side but does not meet VAN criteria to activate code stroke.  Patient is well outside the window for tPA.  Plan to get CTA head and neck and labs.  6:34 PM I reviewed patient's labs and they were unremarkable.  CTA showed tortuous left carotid.  I discussed case with Dr. Iver Nestle from neurology.  She states that patient will need to be transferred to [facility] ER to get MRI brain and cervical spine.  If the MRI showed a stroke, EDP can consult neurology.   Problems Addressed: Left arm weakness: acute illness or injury  Amount and/or Complexity of Data Reviewed Labs: ordered. Decision-making details documented in ED Course. Radiology: ordered and independent interpretation performed. Decision-making details documented in ED Course.  Risk Prescription drug management.    Final Clinical Impression(s) / ED Diagnoses Final diagnoses:  None    Rx / DC Orders ED Discharge Orders     None         Charlynne Pander, MD 03/23/23 [phone]

## 2023-03-23 NOTE — ED Provider Notes (Signed)
Transfer from med [employer].  57 year old female with HIV complaining of left arm numbness and weakness since 6 PM yesterday.  CT angio without any acute findings.  Neurology was consulted and they are recommending MRI for further evaluation.  Patient transferred to [facility] to get MRI. Physical Exam  BP (!) 137/91   Pulse 65   Temp 98.9 F (37.2 C) (Oral)   Resp 18   Wt 94.3 kg   SpO2 98%   BMI 39.30 kg/m   Physical Exam  Procedures  Procedures  ED Course / MDM    Medical Decision Making Amount and/or Complexity of Data Reviewed Labs: ordered. Radiology: ordered.  Risk Prescription drug management. Decision regarding hospitalization.   2100 CT showing multiple acute strokes.  Reviewed with Dr. Derry Lory neurology who is recommending medical admission and he will see in consult.  2130.  Discussed with internal medicine teaching service will evaluate patient for admission.     Terrilee Files, MD 03/24/23 [phone]

## 2023-03-23 NOTE — H&P (Shared)
Date: 03/23/2023         Patient Name:  Stephanie Tyler MRN: [id number]  DOB: [birth year] Age / Sex: 57 y.o., female   PCP: System, Provider Not In         Medical Service: Internal Medicine Teaching Service         Attending Physician: Dr. Oswaldo Done, Marquita Palms, *    First Contact:  Dr. Faith Rogue, DO Pager [phone] Pager: [phone]  Second Contact: Dr. Marrianne Mood, MD Pager [phone] Pager: [phone]       After Hours (After 5p/  First Contact Pager: [phone]  weekends / holidays): Second Contact Pager: [phone]   Chief Concern: Left arm weakness   History of Present Illness  Stephanie Tyler is a 29 year old person with a history significant for controlled HIV, former tobacco use , hypertension, hyperlipidemia, OSA, post surgical hypothyroidism, and fibromyalgia who presented to the ED today after an acute onset of L arm weakness and numbness  Patient was in her usual state of health until about 2:30 PM on 12/20, when she noted involuntarily dropping objects from her left hand. After the 4th episode, patient noted that she was not able to keep her balance, stumbling around her house, and having incoherent speech. This all lasted about 1-2 minutes. About an hour later, patient noted numbness in her L hemibody. No changes in her vision, taste,or ability to move her face.   Patient denies chest pain, palpitations/fluttering, headaches, lightheadedness, shortness of breath prior or during the episode, though she reports it is hard to recall. Of note, patient has 2 days of twinge on her left chest that was palpable to the had. After that, patient has had  several coughing fit for the past two days but no other infectious symptoms, high blood pressure episodes, overt chest pain, immobilization or recent surgeries, or lower extremity pain.   Review of Systems  Constitutional:  Negative for chills, diaphoresis, fever and malaise/fatigue.  HENT: Negative.    Eyes:  Negative for  blurred vision, double vision, photophobia and pain.  Respiratory:  Positive for cough. Negative for wheezing.   Cardiovascular:  Negative for chest pain, palpitations, orthopnea, claudication, leg swelling and PND.  Gastrointestinal:  Negative for heartburn, nausea and vomiting.  Musculoskeletal:  Positive for joint pain and myalgias. Negative for falls.  Skin: Negative.   Neurological:  Negative for headaches.  Endo/Heme/Allergies:  Positive for environmental allergies.  Psychiatric/Behavioral:  Negative for memory loss.     Medical history Hypertension Hyperlipidemia Sleep apnea on cPAP Allergy induced asthma Controlled HIV Graves disease c/b thyroid eye disease Post surgical hypothyroidism Fibromyalgia Headaches PTSD  Depression  Anxiety Lichen planus  Medications Lisinopril 20 mg daily Pravastatin 40 mg Symbicort 160/4.92mcg -  Ventolin 200 mcg daily Juluca 50-25 mg daily  Levothyroxine 100 mcg daily Duloxetine 60 mg daily Gabapentin 600 mg TID Citalopram 40 mg Hydroxyzine 50 mg nightly Tylenol  Metronidazole 250 mg daily  Surgical History: Past Surgical History:  Procedure Laterality Date   COLON SURGERY     EYE SURGERY     OVARIAN CYST REMOVAL      Family History:  History reviewed. No pertinent family history.  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 Former smoker ~30 years; September 2014 quit date after Chantix. Bourbon ocassionally. Never hospitazalized  for alcohol withdrawal. Last drink on Tuesday after a recent birthday party.  Physical Exam: Blood pressure (!) 150/99, pulse 66, temperature 98.1 F (36.7 C), temperature source Oral, resp. rate 17, weight 94.3 kg, SpO2 100%. Constitutional: well-appearing woman, sitting on bed with no acute  distress. HENT: normocephalic atraumatic, mucous membranes moist Cardiovascular: regular rate and rhythm, no m/r/g, no JVD Pulmonary/Chest: normal work of breathing on room air, lungs clear to auscultation bilaterally. No crackles  Abdominal: soft, non-tender, non-distended. No fluid wave and  asterixis Neurological: alert & oriented x 3 Mental Status: Patient is awake, alert, oriented x3 No signs of aphasia or neglect Cranial Nerves: II: Pupils equal, round, and reactive to light.   III,IV, VI: EOMI without ptosis or diploplia.  V: Facial sensation is symmetric to light touch and temperature. VII: Facial movement is symmetric.  VIII: Hearing is intact to voice X: Uvula elevates symmetrically XI: Shoulder shrug is symmetric. XII: Tongue is midline without atrophy or fasciculations.  Motor: 5 out of 5 strength in upper and lower extremities the right.  4 out of 5 strength in lower extremities on the left.  3 out of 5 strength in upper extremities on the left. Sensory: Sensation is grossly intact  bilateral UE & LE Cerebellar: Finger-Nose and Heel-Shin  intact bilaterally MSK: no gross abnormalities. Edema in the lower extremities without pitting  Skin: warm and dry, with melatano Psych: Normal mood and affect   Labs:  aPTT 25  N Prothrombin Time  12.5 N INR 0.6 N    Latest Ref Rng & Units 03/23/2023   11:32 PM 03/23/2023    4:27 PM 08/29/2020    4:03 PM  CMP  Glucose 70 - 99 mg/dL  623  762   BUN 6 - 20 mg/dL  8  17   Creatinine 8.31 - 1.00 mg/dL 5.17  6.16  0.73   Sodium 135 - 145 mmol/L  137  139   Potassium 3.5 - 5.1 mmol/L  3.6  3.8   Chloride 98 - 111 mmol/L  106  100   CO2 22 - 32 mmol/L  24  25   Calcium 8.9 - 10.3 mg/dL  9.3  71.0   Total Protein 6.5 - 8.1 g/dL  7.5  8.6   Total Bilirubin <1.2 mg/dL  0.8  0.5   Alkaline Phos 38 - 126 U/L  72  92   AST 15 - 41 U/L  25  24   ALT 0 - 44 U/L  20  18        Latest Ref Rng & Units 03/23/2023    4:27 PM 08/29/2020     4:03 PM  CBC  WBC 4.0 - 10.5 K/uL 8.1  15.4   Hemoglobin 12.0 - 15.0 g/dL 62.6  94.8   Hematocrit 36.0 - 46.0 % 41.7  47.7   Platelets 150 - 400 K/uL 316  368     Images and other studies: MRI BRAIN  IMPRESSION: Small acute infarcts in the right MCA territory, affecting the posterior insula and frontal operculum. No hemorrhage or mass effect.  MRI CERVICAL SPINE  IMPRESSION: 1. No acute abnormality of the cervical spine. 2. Mild left C7-T1 neural foraminal stenosis. 3. No spinal canal stenosis  CT ANGIO HEAD NECK IMPRESSION: 1. No evidence of acute intracranial abnormality. MRI could provide more sensitive evaluation for acute infarct if clinically warranted. 2. No emergent large vessel occlusion or proximal hemodynamically significant stenosis.  Assessment & Plan:  Stephanie Tyler  is a 1 y.o. hypertension, hyperlipidemia, well-controlled HIV, presented due to concerns for left arm weakness found to have scattered Right MCA infract admitted for cerebrovascular accident.  Principal Problem:   CVA (cerebral vascular accident) [facility])   # Acute Right MCA infarct Patient presented with 3-4 days of left arm weakness with associated left face numbness with no prior history. Small acute infarcts in the right MCA territory, affecting the posterior insula and frontal operculum found on brain MRI with no evidence of hemorrhage .Hx of HTN and HDL which seems well controlled on medical therapy. Remote history of tobacco smoking  but stopped over 10 year ago with chantix. EKG shows no signs of Afib. BMI of 39.3 .Cause of emboli somewhat unclear at this time. Geting a Utox to assess other etiologies per neurology. - Neurology recs appreciated  - Frequent neuro checks - Continue Cardiac telemetry - TTE - PT/OT - Follow up on Utox - Aspirin 81mg  daily along with plavix 75mg  daily x 21 days, followed by Aspirin 81mg  daily alone.  - Permissive hypertension first 24 h < 220/110. Held home  meds.   #Abnormal EKG T wave inversion in the lateral leads. No prior EKG on file. No chest pain ata time of examination.   - Continue tele monitoring. - Follow up on repeat EKG - Troponin evaluation at the onset of chest pain   #Hx of HLD  LDL of 83. ASCVD 4.7%  Will repeat  Hgb A1C to optimize metabolic disease treatment if needed.  - Crestor 40 mg  - Lipid panel   #Hx of HTN On home Lisinopril 20 mg daily. BP 127/74  -  Held home meds.   #Hx of Fibromyalgia  - Resume home Citalopram 40 mg , duloxetine 60 mg and Gabapentin 600 mg   #Lichen planus 2/2 Losartan  - Resume metronidazole  #Allergy induce Asthma   - On Symbicort 160/4.80mcg , Ventolin 200. Will added breo-ellipta as formulary alternative  #Hypothyroidism  - Resume home synthroid 100 mcg  #Hx of HIV - Resume home Juluca 50-25 mg  #Hx of OSA - On home CPAP at night. Resume    Level of care: Telemetry Diet: Regular diet IVF: None applicable VTE: enoxaparin (LOVENOX) injection 40 mg Start: 03/23/23 2215 SCDs Start: 03/23/23 2200 Code: Full Surrogate: Daughter  Signed: Kathleen Lime, MD 03/23/2023, 11:03 PM

## 2023-03-23 NOTE — ED Notes (Signed)
Patient IV was secured with coban.

## 2023-03-23 NOTE — ED Notes (Signed)
 EDP in triage to evaluate pt

## 2023-03-23 NOTE — ED Notes (Signed)
Called MRI to advise that pt has a room in the back, MRI will put in transport ticket to have pt transported to room 31 once they are done.

## 2023-03-23 NOTE — Hospital Course (Addendum)
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 Had 3-4 episodes of dripping things, after the last episode, she was not able to keep her balance, having incoherent speech for 1-2 min according to her daughter.    About an hour later, Stephanie Tyler  realized she could not feel her L hemibody.  No changes in her vision, taste,or ability to move her face.   Stephanie Tyler denies *** chest pain, palpitations, fluttering, lightheadedness, shortness of breath, repeat of similar symptoms.     Of note, Stephanie Tyler has 2 days of twinge on her left chest that was palpable to the had. After that, Stephanie Tyler has had  several coughing fit for the past two days.   HTN Lisinopril 20 mg daily  Fibromyalgia Hyperlipidemia - pravastatin 40 mg Controlled HIV -  R shoulder capsulitis?  Juluca 50-25 mg daily  Xiidra 5% ophthsdol dingle Metronidazole  250 mg for linchen planus, improvin. Losartan***  Allergy induced asthma Symbicort 160//4.68mcg -  Ventolin 200  Duloxetine 60 mg daily Citalopram 40 mg Hydroxyzone 50 mg nightly Gabapentin  Tylenol  arthritis   Chantix -  Cyclobenzaprine 5 mg, does not take it  Allergies - sulfa drugs Emergency contact daughte - Tawni Pummel  Levothryrozine  Recent   In-home care   Tuesday did not feel good Burbon ocassionally. Never hospitazali Former smoker ~30 years; 2014 quit date after Chantix.    Obesity Previously on Ozempic - had severe nausea and discontinued it in 2022. 240->208lb in the past year with lifestyle modifications.

## 2023-03-23 NOTE — ED Triage Notes (Signed)
Pt ambulatory to triage. Pt reports yesterday approx 6pm unable to hold cups in left hand. Lost balance .Per daughter became confused and unable to understand worse. Left arm become numb and weak. HA for 2-3 days prior to episode occurring. Left side weakness  and decreased sensation noted

## 2023-03-23 NOTE — ED Notes (Signed)
Patient went to MRI from lobby. Patient remains in MRI.

## 2023-03-24 ENCOUNTER — Observation Stay (HOSPITAL_COMMUNITY): Payer: 59

## 2023-03-24 DIAGNOSIS — I110 Hypertensive heart disease with heart failure: Secondary | ICD-10-CM | POA: Diagnosis not present

## 2023-03-24 DIAGNOSIS — I63511 Cerebral infarction due to unspecified occlusion or stenosis of right middle cerebral artery: Secondary | ICD-10-CM

## 2023-03-24 DIAGNOSIS — E785 Hyperlipidemia, unspecified: Secondary | ICD-10-CM | POA: Diagnosis not present

## 2023-03-24 DIAGNOSIS — B20 Human immunodeficiency virus [HIV] disease: Secondary | ICD-10-CM

## 2023-03-24 DIAGNOSIS — I639 Cerebral infarction, unspecified: Secondary | ICD-10-CM

## 2023-03-24 DIAGNOSIS — I10 Essential (primary) hypertension: Secondary | ICD-10-CM | POA: Diagnosis not present

## 2023-03-24 DIAGNOSIS — I5020 Unspecified systolic (congestive) heart failure: Secondary | ICD-10-CM | POA: Diagnosis not present

## 2023-03-24 DIAGNOSIS — I63311 Cerebral infarction due to thrombosis of right middle cerebral artery: Secondary | ICD-10-CM | POA: Diagnosis not present

## 2023-03-24 LAB — LIPID PANEL
Cholesterol: 175 mg/dL (ref 0–200)
HDL: 45 mg/dL (ref >40)
LDL Cholesterol: 83 mg/dL (ref 0–99)
Total CHOL/HDL Ratio: 3.9 {ratio}
Triglycerides: 235 mg/dL — ABNORMAL HIGH (ref <150)
VLDL: 47 mg/dL — ABNORMAL HIGH (ref 0–40)

## 2023-03-24 LAB — BASIC METABOLIC PANEL
Anion gap: 8 (ref 5–15)
BUN: 7 mg/dL (ref 6–20)
CO2: 27 mmol/L (ref 22–32)
Calcium: 9.1 mg/dL (ref 8.9–10.3)
Chloride: 105 mmol/L (ref 98–111)
Creatinine, Ser: 0.97 mg/dL (ref 0.44–1.00)
GFR, Estimated: >60 mL/min (ref >60)
Glucose, Bld: 122 mg/dL — ABNORMAL HIGH (ref 70–99)
Potassium: 3.7 mmol/L (ref 3.5–5.1)
Sodium: 140 mmol/L (ref 135–145)

## 2023-03-24 LAB — CREATININE, SERUM
Creatinine, Ser: 0.82 mg/dL (ref 0.44–1.00)
GFR, Estimated: >60 mL/min (ref >60)

## 2023-03-24 LAB — CBC
HCT: 39.2 % (ref 36.0–46.0)
Hemoglobin: 13.1 g/dL (ref 12.0–15.0)
MCH: 31.9 pg (ref 26.0–34.0)
MCHC: 33.4 g/dL (ref 30.0–36.0)
MCV: 95.4 fL (ref 80.0–100.0)
Platelets: 294 10*3/uL (ref 150–400)
RBC: 4.11 MIL/uL (ref 3.87–5.11)
RDW: 14.3 % (ref 11.5–15.5)
WBC: 9.8 10*3/uL (ref 4.0–10.5)
nRBC: 0 % (ref 0.0–0.2)

## 2023-03-24 LAB — RAPID URINE DRUG SCREEN, HOSP PERFORMED
Amphetamines: NOT DETECTED
Barbiturates: NOT DETECTED
Benzodiazepines: NOT DETECTED
Cocaine: NOT DETECTED
Opiates: NOT DETECTED
Tetrahydrocannabinol: POSITIVE — AB

## 2023-03-24 LAB — ECHOCARDIOGRAM COMPLETE
Calc EF: 38.9 %
Height: 61 in
S' Lateral: 4 cm
Single Plane A2C EF: 46.6 %
Single Plane A4C EF: 25.8 %
Weight: 3328 [oz_av]

## 2023-03-24 LAB — GLUCOSE, CAPILLARY: Glucose-Capillary: 136 mg/dL — ABNORMAL HIGH (ref 70–99)

## 2023-03-24 LAB — HEMOGLOBIN A1C
Hgb A1c MFr Bld: 5.6 % (ref 4.8–5.6)
Mean Plasma Glucose: 114.02 mg/dL

## 2023-03-24 MED ORDER — ROSUVASTATIN CALCIUM 20 MG PO TABS
40.0000 mg | ORAL_TABLET | Freq: Every day | ORAL | Status: DC
  Administered 2023-03-24 – 2023-03-25 (×2): 40 mg via ORAL
  Filled 2023-03-24 (×2): qty 2

## 2023-03-24 MED ORDER — METRONIDAZOLE 500 MG PO TABS
250.0000 mg | ORAL_TABLET | Freq: Every day | ORAL | Status: DC
  Administered 2023-03-24 – 2023-03-25 (×2): 250 mg via ORAL
  Filled 2023-03-24 (×2): qty 0.5
  Filled 2023-03-24: qty 1
  Filled 2023-03-24: qty 0.5

## 2023-03-24 MED ORDER — STUDY - OCEANIC-STROKE - ASUNDEXIAN 50 MG OR PLACEBO TABLET (PI-SETHI)
1.0000 | ORAL_TABLET | Freq: Every day | ORAL | Status: DC
  Administered 2023-03-24 – 2023-03-25 (×2): 50 mg via ORAL
  Filled 2023-03-24 (×3): qty 1

## 2023-03-24 MED ORDER — CITALOPRAM HYDROBROMIDE 40 MG PO TABS
40.0000 mg | ORAL_TABLET | Freq: Every day | ORAL | Status: DC
  Administered 2023-03-24 – 2023-03-25 (×2): 40 mg via ORAL
  Filled 2023-03-24 (×2): qty 1

## 2023-03-24 MED ORDER — CLOPIDOGREL BISULFATE 75 MG PO TABS
75.0000 mg | ORAL_TABLET | Freq: Every day | ORAL | Status: DC
  Administered 2023-03-24 – 2023-03-25 (×2): 75 mg via ORAL
  Filled 2023-03-24 (×2): qty 1

## 2023-03-24 MED ORDER — ALBUTEROL SULFATE (2.5 MG/3ML) 0.083% IN NEBU: 2.5000 mg | INHALATION_SOLUTION | RESPIRATORY_TRACT | Status: DC | PRN

## 2023-03-24 MED ORDER — DOLUTEGRAVIR-RILPIVIRINE 50-25 MG PO TABS
1.0000 | ORAL_TABLET | Freq: Every day | ORAL | Status: DC
  Administered 2023-03-24 – 2023-03-25 (×2): 1 via ORAL
  Filled 2023-03-24 (×4): qty 1

## 2023-03-24 MED ORDER — HYDROXYZINE HCL 25 MG PO TABS
50.0000 mg | ORAL_TABLET | Freq: Every evening | ORAL | Status: DC | PRN
  Administered 2023-03-24: 50 mg via ORAL
  Filled 2023-03-24: qty 2

## 2023-03-24 MED ORDER — LIDOCAINE 5 % EX PTCH
1.0000 | MEDICATED_PATCH | Freq: Once | CUTANEOUS | Status: AC
  Administered 2023-03-24: 1 via TRANSDERMAL
  Filled 2023-03-24: qty 1

## 2023-03-24 MED ORDER — ASPIRIN 81 MG PO TBEC
81.0000 mg | DELAYED_RELEASE_TABLET | Freq: Every day | ORAL | Status: DC
  Administered 2023-03-24 – 2023-03-25 (×2): 81 mg via ORAL
  Filled 2023-03-24 (×2): qty 1

## 2023-03-24 MED ORDER — FLUTICASONE FUROATE-VILANTEROL 100-25 MCG/ACT IN AEPB
1.0000 | INHALATION_SPRAY | Freq: Every day | RESPIRATORY_TRACT | Status: DC
  Administered 2023-03-24 – 2023-03-25 (×2): 1 via RESPIRATORY_TRACT
  Filled 2023-03-24: qty 28

## 2023-03-24 MED ORDER — GABAPENTIN 300 MG PO CAPS
600.0000 mg | ORAL_CAPSULE | Freq: Three times a day (TID) | ORAL | Status: DC
Start: 2023-03-24 — End: 2023-03-25
  Administered 2023-03-24 – 2023-03-25 (×4): 600 mg via ORAL
  Filled 2023-03-24 (×2): qty 2
  Filled 2023-03-24: qty 6
  Filled 2023-03-24 (×2): qty 2

## 2023-03-24 NOTE — Plan of Care (Signed)

## 2023-03-24 NOTE — Progress Notes (Signed)
VASCULAR LAB    TCD bubble study has been performed.  See CV proc for preliminary results.   Eiley Mcginnity, RVT 03/24/2023, 12:43 PM

## 2023-03-24 NOTE — Evaluation (Signed)
Physical Therapy Evaluation Patient Details Name: Stephanie Tyler MRN: [id number] DOB: 1965/10/05 Today's Date: 03/24/2023  History of Present Illness  57 y/o F presenting to ED on 12/20 with numbnesss/weakness in L side, MRI brain with small acute infarcts in R MCA terrirotry affecting posterior insula and frontal operculum. PMH includes HIV, hypothyroidism  Clinical Impression  Pt admitted with above diagnosis. At baseline, pt independent and working. Today, she presents with decreased sensation and mild decreased strength on L side.  She is still able to transfer and ambulate in room independently and ambulated 250' with therapy and performed stairs with mild gait deviations.  Pt expected to progress well. Do recommend outpt PT at d/c.   Pt currently with functional limitations due to the deficits listed below (see PT Problem List). Pt will benefit from acute skilled PT to increase their independence and safety with mobility to allow discharge.    Noted order for orthostatic BP and checked during session: Supine 164/94 Sitting 159/105 Stand 158/103 Supine 161/83 Notified RN negative orthostatic BP but did have elevated BP.          If plan is discharge home, recommend the following: Assistance with cooking/housework;Help with stairs or ramp for entrance   Can travel by private vehicle        Equipment Recommendations None recommended by PT  Recommendations for Other Services       Functional Status Assessment Patient has had a recent decline in their functional status and demonstrates the ability to make significant improvements in function in a reasonable and predictable amount of time.     Precautions / Restrictions Precautions Precautions: None      Mobility  Bed Mobility Overal bed mobility: Modified Independent                  Transfers Overall transfer level: Modified independent                 General transfer comment: Demonstrated STS without  difficulty    Ambulation/Gait Ambulation/Gait assistance: Supervision Gait Distance (Feet): 250 Feet Assistive device: None Gait Pattern/deviations: Step-through pattern Gait velocity: normal     General Gait Details: mild decrease in weight shift to L ;reports decreased sensation on L; steady gait  Stairs Stairs: Yes Stairs assistance: Contact guard assist Stair Management: Two rails, Alternating pattern, Forwards Number of Stairs: 5 General stair comments: performed safely with increased time  Wheelchair Mobility     Tilt Bed    Modified Rankin (Stroke Patients Only) Modified Rankin (Stroke Patients Only) Pre-Morbid Rankin Score: No symptoms Modified Rankin: Moderate disability     Balance Overall balance assessment: Needs assistance Sitting-balance support: No upper extremity supported Sitting balance-Leahy Scale: Normal     Standing balance support: No upper extremity supported Standing balance-Leahy Scale: Good                               Pertinent Vitals/Pain Pain Assessment Pain Assessment: No/denies pain    Home Living Family/patient expects to be discharged to:: Private residence Living Arrangements: Children Available Help at Discharge: Family;Available 24 hours/day Type of Home:  (3rd floor (only 1 flight to enter though)) Home Access: Stairs to enter Entrance Stairs-Rails: Can reach both Entrance Stairs-Number of Steps: 1 flight   Home Layout: One level Home Equipment: None      Prior Function Prior Level of Function : Independent/Modified Independent;Driving;Working/employed  Mobility Comments: independent with community ambulation 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     Extremity/Trunk Assessment   Upper Extremity Assessment Upper Extremity Assessment: Right hand dominant;LUE deficits/detail LUE Deficits / Details: 4/5, mild weakness and decr coordination, reports decr sensation throughout LUE  Sensation: decreased light touch;decreased proprioception LUE Coordination: decreased fine motor    Lower Extremity Assessment Lower Extremity Assessment: LLE deficits/detail;RLE deficits/detail RLE Deficits / Details: ROM WFL; MMT 5/5 LLE Deficits / Details: ROM WFL; MMT 4/5 LLE Sensation: decreased light touch;decreased proprioception LLE Coordination: decreased fine motor    Cervical / Trunk Assessment Cervical / Trunk Assessment: Normal;Other exceptions Cervical / Trunk Exceptions: facial droop on L  Communication      Cognition Arousal: Alert Behavior During Therapy: WFL for tasks assessed/performed Overall Cognitive Status: Within Functional Limits for tasks assessed                                 General Comments: WFL for task during therapy        General Comments  Educated pt on safety with decreased sensation.  Encouraged stimulation to UE/LE with touch, washcloth, different textures for attempts to improve sensation.  Educated on AROM as able and STS for exercise. Recommend outpt PT - pt agreeable.     Exercises     Assessment/Plan    PT Assessment Patient needs continued PT services  PT Problem List Decreased strength;Pain;Decreased range of motion;Decreased activity tolerance;Decreased balance;Decreased mobility;Decreased knowledge of use of DME;Decreased coordination;Impaired sensation       PT Treatment Interventions DME instruction;Therapeutic exercise;Gait training;Stair training;Functional mobility training;Therapeutic activities;Patient/family education;Neuromuscular re-education;Balance training;Modalities    PT Goals (Current goals can be found in the Care Plan section)  Acute Rehab PT Goals Patient Stated Goal: return to normal PT Goal Formulation: With patient Time For Goal Achievement: 04/07/23 Potential to Achieve Goals: Good Additional Goals Additional Goal #1: Will increase L LE strength to 5/5 for improved transfers     Frequency Min 1X/week     Co-evaluation               AM-PAC PT "6 Clicks" Mobility  Outcome Measure Help needed turning from your back to your side while in a flat bed without using bedrails?: None Help needed moving from lying on your back to sitting on the side of a flat bed without using bedrails?: None Help needed moving to and from a bed to a chair (including a wheelchair)?: A Little Help needed standing up from a chair using your arms (e.g., wheelchair or bedside chair)?: A Little Help needed to walk in hospital room?: A Little Help needed climbing 3-5 steps with a railing? : A Little 6 Click Score: 20    End of Session Equipment Utilized During Treatment: Gait belt Activity Tolerance: Patient tolerated treatment well Patient left: in bed;with call bell/phone within reach Nurse Communication: Mobility status PT Visit Diagnosis: Other abnormalities of gait and mobility (R26.89);Hemiplegia and hemiparesis Hemiplegia - Right/Left: Left Hemiplegia - dominant/non-dominant: Non-dominant Hemiplegia - caused by: Cerebral infarction    Time: 1349-1425 PT Time Calculation (min) (ACUTE ONLY): 36 min   Charges:   PT Evaluation $PT Eval Moderate Complexity: 1 Mod PT Treatments $Gait Training: 8-22 mins PT General Charges $$ ACUTE PT VISIT: 1 Visit         Anise Salvo, PT Acute Rehab [facility] [phone]   Rayetta Humphrey 03/24/2023, 2:36 PM

## 2023-03-24 NOTE — Progress Notes (Addendum)
STROKE TEAM PROGRESS NOTE   BRIEF HPI [name] is a 57 y.o. female with history of HIV, hypothyroidism, Graves' disease, fibromyalgia, hyperlipidemia and hypertension presenting with left arm weakness and left hemibody numbness.  MRI brain demonstrated right MCA distribution infarcts  NIH on Admission 2  INTERIM HISTORY/SUBJECTIVE Patient has been hemodynamically stable and afebrile overnight.  She reports that her left-sided numbness has improved somewhat. MRI scan of the brain shows small acute infarcts in the right MCA territory involving the posterior insula and frontal operculum.  CT angiogram shows no significant large vessel stenosis or occlusion OBJECTIVE  CBC    Component Value Date/Time   WBC 9.8 03/24/2023 0531   RBC 4.11 03/24/2023 0531   HGB 13.1 03/24/2023 0531   HCT 39.2 03/24/2023 0531   PLT 294 03/24/2023 0531   MCV 95.4 03/24/2023 0531   MCH 31.9 03/24/2023 0531   MCHC 33.4 03/24/2023 0531   RDW 14.3 03/24/2023 0531   LYMPHSABS 2.2 03/23/2023 1627   MONOABS 0.3 03/23/2023 1627   EOSABS 0.2 03/23/2023 1627   BASOSABS 0.0 03/23/2023 1627    BMET    Component Value Date/Time   NA 140 03/24/2023 0531   K 3.7 03/24/2023 0531   CL 105 03/24/2023 0531   CO2 27 03/24/2023 0531   GLUCOSE 122 (H) 03/24/2023 0531   BUN 7 03/24/2023 0531   CREATININE 0.97 03/24/2023 0531   CALCIUM 9.1 03/24/2023 0531   GFRNONAA >60 03/24/2023 0531    IMAGING past 24 hours MR Cervical Spine Wo Contrast Result Date: 03/23/2023 CLINICAL DATA:  Ataxia EXAM: MRI CERVICAL SPINE WITHOUT CONTRAST TECHNIQUE: Multiplanar, multisequence MR imaging of the cervical spine was performed. No intravenous contrast was administered. COMPARISON:  10/10/2021 FINDINGS: Alignment: Physiologic. Vertebrae: No fracture, evidence of discitis, or bone lesion. Cord: Normal signal and morphology. Posterior Fossa, vertebral arteries, paraspinal tissues: Unchanged cystic focus at the inferior right  thyroid. No follow-up imaging required. Disc levels: C1-2: Unremarkable. C2-3: Normal disc space and facet joints. There is no spinal canal stenosis. No neural foraminal stenosis. C3-4: Unchanged small central disc protrusion. There is no spinal canal stenosis. No neural foraminal stenosis. C4-5: Minimal disc bulge. There is no spinal canal stenosis. No neural foraminal stenosis. C5-6: Small right subarticular disc protrusion. There is no spinal canal stenosis. No neural foraminal stenosis. C6-7: Tiny left subarticular disc protrusion. There is no spinal canal stenosis. No neural foraminal stenosis. C7-T1: Small left foraminal protrusion. There is no spinal canal stenosis. Mild left neural foraminal stenosis. IMPRESSION: 1. No acute abnormality of the cervical spine. 2. Mild left C7-T1 neural foraminal stenosis. 3. No spinal canal stenosis. Electronically Signed   By: Deatra Robinson M.D.   On: 03/23/2023 20:56   MR BRAIN WO CONTRAST Result Date: 03/23/2023 CLINICAL DATA:  Acute neurologic deficit EXAM: MRI HEAD WITHOUT CONTRAST TECHNIQUE: Multiplanar, multiecho pulse sequences of the brain and surrounding structures were obtained without intravenous contrast. COMPARISON:  02/08/2016 FINDINGS: Brain: Multifocal abnormal diffusion restriction in the right MCA territory, affecting the posterior insula and frontal operculum. No acute or chronic hemorrhage. Normal white matter signal, parenchymal volume and CSF spaces. The midline structures are normal. Vascular: Normal flow voids. Skull and upper cervical spine: Normal calvarium and skull base. Visualized upper cervical spine and soft tissues are normal. Sinuses/Orbits:No paranasal sinus fluid levels or advanced mucosal thickening. No mastoid or middle ear effusion. Normal orbits. 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  No hemorrhage or mass effect. Electronically Signed   By: Deatra Robinson  M.D.   On: 03/23/2023 20:41   CT ANGIO HEAD NECK W WO CM Result Date: 03/23/2023 CLINICAL DATA:  Neuro deficit, acute, stroke suspected L arm weakness r/o stroke EXAM: CT ANGIOGRAPHY HEAD AND NECK WITH AND WITHOUT CONTRAST TECHNIQUE: Multidetector CT imaging of the head and neck was performed using the standard protocol during bolus administration of intravenous contrast. Multiplanar CT image reconstructions and MIPs were obtained to evaluate the vascular anatomy. Carotid stenosis measurements (when applicable) are obtained utilizing NASCET criteria, using the distal internal carotid diameter as the denominator. RADIATION DOSE REDUCTION: This exam was performed according to the departmental dose-optimization program which includes automated exposure control, adjustment of the mA and/or kV according to patient size and/or use of iterative reconstruction technique. CONTRAST:  75mL OMNIPAQUE IOHEXOL 350 MG/ML SOLN COMPARISON:  None Available. FINDINGS: CT HEAD FINDINGS Brain: No evidence of acute large vascular territory infarction, hemorrhage, hydrocephalus, extra-axial collection or mass lesion/mass effect. Vascular: See below. Skull: No acute fracture. Sinuses/Orbits: No acute finding. Other: No mastoid effusions. Review of the MIP images confirms the above findings CTA NECK FINDINGS Aortic arch: Great vessel origins are patent without significant stenosis. Right carotid system: No evidence of dissection, stenosis (50% or greater), or occlusion. Retropharyngeal course. Left carotid system: No evidence of dissection, stenosis (50% or greater), or occlusion. Vertebral arteries: Left dominant. No evidence of dissection, stenosis (50% or greater), or occlusion. Skeleton: No acute abnormality on limited assessment. Other neck: Right thyroid nodule better characterized on prior ultrasound. Upper chest: Visualized lung apices are clear. Review of the MIP images confirms the above findings CTA HEAD FINDINGS Anterior  circulation: Bilateral intracranial ICAs, MCAs, and ACAs are patent without proximal hemodynamically significant stenosis. Posterior circulation: Bilateral intradural vertebral arteries, basilar artery and bilateral posterior cerebral arteries are patent without proximal hemodynamically significant stenosis. Venous sinuses: As permitted by contrast timing, patent. Review of the MIP images confirms the above findings IMPRESSION: 1. No evidence of acute intracranial abnormality. MRI could provide more sensitive evaluation for acute infarct if clinically warranted. 2. No emergent large vessel occlusion or proximal hemodynamically significant stenosis. Electronically Signed   By: Feliberto Harts M.D.   On: 03/23/2023 18:49    Vitals:   03/24/23 0403 03/24/23 0508 03/24/23 0806 03/24/23 0837  BP: (!) 155/106  (!) 149/98   Pulse: 77  72   Resp: 18     Temp: 98.4 F (36.9 C)  98.3 F (36.8 C)   TempSrc: Oral  Oral   SpO2: 100%  100% 99%  Weight:      Height:  5\' 1"  (1.549 m)       PHYSICAL EXAM General:  Alert, mildly obese pleasant middle-aged African-American lady in no acute distress Psych:  Mood and affect appropriate for situation CV: Regular rate and rhythm on monitor Respiratory:  Regular, unlabored respirations on room air  NEURO:  Mental Status: AA&Ox3, patient is able to give clear and coherent history Speech/Language: speech is without dysarthria or aphasia.    Cranial Nerves:  II: PERRL.  III, IV, VI: EOMI. left eyelid droop, chronic status post surgery V: Sensation is intact to light touch and diminished on the left VII: Slight left-sided facial droop VIII: hearing intact to voice. IX, X: Phonation is normal.  XII: tongue is midline without fasciculations. Motor: Able to move all 4 extremities with good antigravity strength, diminished fine finger movements on the left.  Orbits right  over left upper extremity. 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  CTA head & neck no LVO or hemodynamically significant stenosis MRI small acute infarcts in the right posterior insula and frontal operculum 2D Echo pending LDL 83 HgbA1c 5.6 VTE prophylaxis -SCDs No antithrombotic prior to admission, now on aspirin 81 mg daily and clopidogrel 75 mg daily for 3 weeks and then aspirin alone. Therapy recommendations:  Pending Disposition: pending  Hypertension Home meds: Lisinopril 20 mg daily Stable Blood Pressure Goal: BP less than 220/110   Hyperlipidemia Home meds: Pravastatin 40 mg daily, changed to rosuvastatin 40 mg daily LDL 83, goal < 70 Continue statin at discharge  Other Stroke Risk Factors Obesity, Body mass index is 39.3 kg/m., BMI >/= 30 associated with increased stroke risk, recommend weight loss, diet and exercise as appropriate    Other Active Problems HIV infection-continue home [facility] day # 0  Patient seen by NP with MD, MD to edit note as needed. Cortney E Ernestina Columbia , MSN, AGACNP-BC [facility] See Amion for schedule and pager information 03/24/2023 12:34 PM   STROKE MD NOTE :  I have personally obtained history,examined this patient, reviewed notes, independently viewed imaging studies, participated in medical decision making and plan of care.ROS completed by me personally and pertinent positives fully documented  I have made any additions or clarifications directly to the above note. Agree with note above.  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   CT angiogram shows no large vessel stenosis or occlusion.  Echocardiogram is pending.  Continue cardiac monitoring.  Will need 30-day cardiac monitoring at discharge to look for paroxysmal A-fib.  Check TCD bubble study for right-to-left shunt.  Check ANA panel and anticardiolipin antibodies.  Aspirin and Plavix for 3 weeks followed by aspirin alone and aggressive risk factor modification. Patient may also consider possible participation in the [facility]  stroke prevention trial(standard of care antiplatelet therapy with and without Asundexian - factor XI inhibitor.)  She.  She was given written information to review and decide.  It was made clear study participation is voluntary and moreover she can withdraw participation at any time in the future is not interested.  She will get the same excellent standard of care irrespective of whether she participate in the study or not. The patient has history of HIV and takes medications but I specifically asked the pharmacist on call to check for interaction between study medication and Juluca that she is taking for HIV and was told these 2 medications do not significantly interact by pharmacist on-call.  Greater than 50% time during this 50-minute visit was spent on counseling and coordination of care about her cryptogenic stroke and discussion about stroke evaluation and prevention and treatment.  Delia Heady, MD Medical Director [facility] Pager: [phone] 03/24/2023 2:23 PM   To contact Stroke Continuity provider, please refer to [email or web address]. After hours, contact General Neurology

## 2023-03-24 NOTE — ED Notes (Signed)
ED TO INPATIENT HANDOFF REPORT  ED Nurse Name and Phone #: Lenox Ponds Name/Age/Gender Stephanie Tyler 57 y.o. female Room/Bed: 031C/031C  Code Status   Code Status: Full Code  Home/SNF/Other Home Patient oriented to: self, place, time, and situation Is this baseline? Yes   Triage Complete: Triage complete  Chief Complaint CVA (cerebral vascular accident) [facility]) [I63.9]  Triage Note Pt ambulatory to triage. Pt reports yesterday approx 6pm unable to hold cups in left hand. Lost balance .Per daughter became confused and unable to understand worse. Left arm become numb and weak. HA for 2-3 days prior to episode occurring. Left side weakness  and decreased sensation noted    Allergies Allergies  Allergen Reactions   Elemental Sulfur Other (See Comments)   Sulfamethoxazole Other (See Comments)    Unknown to pt    Level of Care/Admitting Diagnosis ED Disposition     ED Disposition  Admit   Condition  --   Comment  Hospital Area: [facility] [[id number]]  Level of Care: Telemetry Medical [104]  May place patient in observation at [facility] or [facility] if equivalent level of care is available:: No  Covid Evaluation: Asymptomatic - no recent exposure (last 10 days) testing not required  Diagnosis: CVA (cerebral vascular accident) [facility]) [[id number]]  Admitting Physician: Tyson Alias [[id number]]  Attending Physician: Tyson Alias [phone]          B Medical/Surgery History Past Medical History:  Diagnosis Date   Diverticulitis    Fibromyalgia    HIV (human immunodeficiency virus infection) (HCC)    Thyroid disease    Past Surgical History:  Procedure Laterality Date   COLON SURGERY     EYE SURGERY     OVARIAN CYST REMOVAL       A IV Location/Drains/Wounds Patient Lines/Drains/Airways Status     Active Line/Drains/Airways     Name Placement date Placement time Site Days   Peripheral IV 03/23/23 18 G Right Antecubital  03/23/23  1634  Antecubital  1            Intake/Output Last 24 hours No intake or output data in the 24 hours ending 03/24/23 0128  Labs/Imaging Results for orders placed or performed during the hospital encounter of 03/23/23 (from the past 48 hours)  Protime-INR     Status: None   Collection Time: 03/23/23  4:27 PM  Result Value Ref Range   Prothrombin Time 12.5 11.4 - 15.2 seconds   INR 0.9 0.8 - 1.2    Comment: (NOTE) INR goal varies based on device and disease states. Performed at [facility], [address] [place], [place] [address]   APTT     Status: None   Collection Time: 03/23/23  4:27 PM  Result Value Ref Range   aPTT 25 24 - 36 seconds    Comment: Performed at [facility], [address] [place], [place] [address]  CBC     Status: None   Collection Time: 03/23/23  4:27 PM  Result Value Ref Range   WBC 8.1 4.0 - 10.5 K/uL   RBC 4.48 3.87 - 5.11 MIL/uL   Hemoglobin 14.2 12.0 - 15.0 g/dL   HCT 18.8 41.6 - 60.6 %   MCV 93.1 80.0 - 100.0 fL   MCH 31.7 26.0 - 34.0 pg   MCHC 34.1 30.0 - 36.0 g/dL   RDW 30.1 60.1 - 09.3 %   Platelets 316 150 -  400 K/uL   nRBC 0.0 0.0 - 0.2 %    Comment: Performed at [facility], [address] [place], [place] [address]  Differential     Status: None   Collection Time: 03/23/23  4:27 PM  Result Value Ref Range   Neutrophils Relative % 65 %   Neutro Abs 5.3 1.7 - 7.7 K/uL   Lymphocytes Relative 27 %   Lymphs Abs 2.2 0.7 - 4.0 K/uL   Monocytes Relative 4 %   Monocytes Absolute 0.3 0.1 - 1.0 K/uL   Eosinophils Relative 3 %   Eosinophils Absolute 0.2 0.0 - 0.5 K/uL   Basophils Relative 1 %   Basophils Absolute 0.0 0.0 - 0.1 K/uL   Immature Granulocytes 0 %   Abs Immature Granulocytes 0.03 0.00 - 0.07 K/uL    Comment: Performed at [facility], [address] [place], [place] [address]  Comprehensive metabolic panel     Status: Abnormal   Collection Time: 03/23/23  4:27 PM   Result Value Ref Range   Sodium 137 135 - 145 mmol/L   Potassium 3.6 3.5 - 5.1 mmol/L   Chloride 106 98 - 111 mmol/L   CO2 24 22 - 32 mmol/L   Glucose, Bld 121 (H) 70 - 99 mg/dL    Comment: Glucose reference range applies only to samples taken after fasting for at least 8 hours.   BUN 8 6 - 20 mg/dL   Creatinine, Ser 5.78 0.44 - 1.00 mg/dL   Calcium 9.3 8.9 - 46.9 mg/dL   Total Protein 7.5 6.5 - 8.1 g/dL   Albumin 4.0 3.5 - 5.0 g/dL   AST 25 15 - 41 U/L   ALT 20 0 - 44 U/L   Alkaline Phosphatase 72 38 - 126 U/L   Total Bilirubin 0.8 <1.2 mg/dL   GFR, Estimated >62 >95 mL/min    Comment: (NOTE) Calculated using the CKD-EPI Creatinine Equation (2021)    Anion gap 7 5 - 15    Comment: Performed at [facility], [address] [place], [place] [address]  Ethanol     Status: None   Collection Time: 03/23/23  4:27 PM  Result Value Ref Range   Alcohol, Ethyl (B) <10 <10 mg/dL    Comment: (NOTE) Lowest detectable limit for serum alcohol is 10 mg/dL.  For medical purposes only. Performed at [facility], [address] [place], [place] [address]   CBG monitoring, ED     Status: Abnormal   Collection Time: 03/23/23  4:35 PM  Result Value Ref Range   Glucose-Capillary 127 (H) 70 - 99 mg/dL    Comment: Glucose reference range applies only to samples taken after fasting for at least 8 hours.  Lipid panel     Status: Abnormal   Collection Time: 03/23/23 11:00 PM  Result Value Ref Range   Cholesterol 175 0 - 200 mg/dL   Triglycerides 027 (H) <150 mg/dL   HDL 45 >25 mg/dL   Total CHOL/HDL Ratio 3.9 RATIO   VLDL 47 (H) 0 - 40 mg/dL   LDL Cholesterol 83 0 - 99 mg/dL    Comment:        Total Cholesterol/HDL:CHD Risk Coronary Heart Disease Risk Table                     Men   Women  1/2 Average Risk   3.4   3.3  Average  Risk       5.0   4.4  2 X Average Risk   9.6   7.1  3 X Average Risk  23.4   11.0        Use the calculated Patient Ratio above and the  CHD Risk Table to determine the patient's CHD Risk.        ATP III CLASSIFICATION (LDL):  <100     mg/dL   Optimal  284-132  mg/dL   Near or Above                    Optimal  130-159  mg/dL   Borderline  440-102  mg/dL   High  >725     mg/dL   Very High Performed at [facility], [address] [place], [place] [address]   Creatinine, serum     Status: None   Collection Time: 03/23/23 11:32 PM  Result Value Ref Range   Creatinine, Ser 0.82 0.44 - 1.00 mg/dL   GFR, Estimated >03 >47 mL/min    Comment: (NOTE) Calculated using the CKD-EPI Creatinine Equation (2021) Performed at [facility], [address], [place], [place] [address]    MR Cervical Spine Wo Contrast Result Date: 03/23/2023 CLINICAL DATA:  Ataxia EXAM: MRI CERVICAL SPINE WITHOUT CONTRAST TECHNIQUE: Multiplanar, multisequence MR imaging of the cervical spine was performed. No intravenous contrast was administered. COMPARISON:  10/10/2021 FINDINGS: Alignment: Physiologic. Vertebrae: No fracture, evidence of discitis, or bone lesion. Cord: Normal signal and morphology. Posterior Fossa, vertebral arteries, paraspinal tissues: Unchanged cystic focus at the inferior right thyroid. No follow-up imaging required. Disc levels: C1-2: Unremarkable. C2-3: Normal disc space and facet joints. There is no spinal canal stenosis. No neural foraminal stenosis. C3-4: Unchanged small central disc protrusion. There is no spinal canal stenosis. No neural foraminal stenosis. C4-5: Minimal disc bulge. There is no spinal canal stenosis. No neural foraminal stenosis. C5-6: Small right subarticular disc protrusion. There is no spinal canal stenosis. No neural foraminal stenosis. C6-7: Tiny left subarticular disc protrusion. There is no spinal canal stenosis. No neural foraminal stenosis. C7-T1: Small left foraminal protrusion. There is no spinal canal stenosis. Mild left neural foraminal stenosis. IMPRESSION: 1. No acute abnormality of the  cervical spine. 2. Mild left C7-T1 neural foraminal stenosis. 3. No spinal canal stenosis. Electronically Signed   By: Deatra Robinson M.D.   On: 03/23/2023 20:56   MR BRAIN WO CONTRAST Result Date: 03/23/2023 CLINICAL DATA:  Acute neurologic deficit EXAM: MRI HEAD WITHOUT CONTRAST TECHNIQUE: Multiplanar, multiecho pulse sequences of the brain and surrounding structures were obtained without intravenous contrast. COMPARISON:  02/08/2016 FINDINGS: Brain: Multifocal abnormal diffusion restriction in the right MCA territory, affecting the posterior insula and frontal operculum. No acute or chronic hemorrhage. Normal white matter signal, parenchymal volume and CSF spaces. The midline structures are normal. Vascular: Normal flow voids. Skull and upper cervical spine: Normal calvarium and skull base. Visualized upper cervical spine and soft tissues are normal. Sinuses/Orbits:No paranasal sinus fluid levels or advanced mucosal thickening. No mastoid or middle ear effusion. Normal orbits. 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 No hemorrhage or mass effect. Electronically Signed   By: Deatra Robinson M.D.   On: 03/23/2023 20:41   CT ANGIO HEAD NECK W WO CM Result Date: 03/23/2023 CLINICAL DATA:  Neuro deficit, acute, stroke suspected L arm weakness r/o stroke EXAM: CT ANGIOGRAPHY HEAD AND NECK  WITH AND WITHOUT CONTRAST TECHNIQUE: Multidetector CT imaging of the head and neck was performed using the standard protocol during bolus administration of intravenous contrast. Multiplanar CT image reconstructions and MIPs were obtained to evaluate the vascular anatomy. Carotid stenosis measurements (when applicable) are obtained utilizing NASCET criteria, using the distal internal carotid diameter as the denominator. RADIATION DOSE REDUCTION: This exam was performed according to the departmental dose-optimization program which includes automated exposure  control, adjustment of the mA and/or kV according to patient size and/or use of iterative reconstruction technique. CONTRAST:  75mL OMNIPAQUE IOHEXOL 350 MG/ML SOLN COMPARISON:  None Available. FINDINGS: CT HEAD FINDINGS Brain: No evidence of acute large vascular territory infarction, hemorrhage, hydrocephalus, extra-axial collection or mass lesion/mass effect. Vascular: See below. Skull: No acute fracture. Sinuses/Orbits: No acute finding. Other: No mastoid effusions. Review of the MIP images confirms the above findings CTA NECK FINDINGS Aortic arch: Great vessel origins are patent without significant stenosis. Right carotid system: No evidence of dissection, stenosis (50% or greater), or occlusion. Retropharyngeal course. Left carotid system: No evidence of dissection, stenosis (50% or greater), or occlusion. Vertebral arteries: Left dominant. No evidence of dissection, stenosis (50% or greater), or occlusion. Skeleton: No acute abnormality on limited assessment. Other neck: Right thyroid nodule better characterized on prior ultrasound. Upper chest: Visualized lung apices are clear. Review of the MIP images confirms the above findings CTA HEAD FINDINGS Anterior circulation: Bilateral intracranial ICAs, MCAs, and ACAs are patent without proximal hemodynamically significant stenosis. Posterior circulation: Bilateral intradural vertebral arteries, basilar artery and bilateral posterior cerebral arteries are patent without proximal hemodynamically significant stenosis. Venous sinuses: As permitted by contrast timing, patent. Review of the MIP images confirms the above findings IMPRESSION: 1. No evidence of acute intracranial abnormality. MRI could provide more sensitive evaluation for acute infarct if clinically warranted. 2. No emergent large vessel occlusion or proximal hemodynamically significant stenosis. Electronically Signed   By: Feliberto Harts M.D.   On: 03/23/2023 18:49    Pending Labs Unresulted Labs  (From admission, onward)     Start     Ordered   03/30/23 0500  Creatinine, serum  (enoxaparin (LOVENOX)    CrCl >/= 30 ml/min)  Weekly,   R     Comments: while on enoxaparin therapy    03/23/23 2210   03/24/23 0500  Basic metabolic panel  Tomorrow morning,   R        03/23/23 2210   03/24/23 0500  CBC  Tomorrow morning,   R        03/23/23 2210   03/23/23 2205  Hemoglobin A1c  Add-on,   AD        03/23/23 2210            Vitals/Pain Today's Vitals   03/23/23 2114 03/23/23 2300 03/23/23 2330 03/24/23 0000  BP: (!) 150/99 (!) 155/87 131/85 127/74  Pulse: 66 74 71 75  Resp: 17 15 19  (!) 23  Temp: 98.1 F (36.7 C)     TempSrc: Oral     SpO2: 100% 100% 100% 99%  Weight:      PainSc:        Isolation Precautions No active isolations  Medications Medications  sodium chloride flush (NS) 0.9 % injection 3 mL (3 mLs Intravenous Not Given 03/23/23 1703)  LORazepam (ATIVAN) injection 1 mg (has no administration in time range)  levothyroxine (SYNTHROID) tablet 100 mcg (has no administration in time range)  enoxaparin (LOVENOX) injection 40 mg (40 mg Subcutaneous Given 03/23/23  2333)  acetaminophen (TYLENOL) tablet 650 mg (650 mg Oral Given 03/23/23 2332)    Or  acetaminophen (TYLENOL) suppository 650 mg ( Rectal See Alternative 03/23/23 2332)  senna-docusate (Senokot-S) tablet 1 tablet (has no administration in time range)  iohexol (OMNIPAQUE) 350 MG/ML injection 75 mL (75 mLs Intravenous Contrast Given 03/23/23 1747)    Mobility walks     Focused Assessments Neuro Assessment Handoff:  Swallow screen pass? Yes  Cardiac Rhythm: Normal sinus rhythm NIH Stroke Scale  Dizziness Present: No Headache Present: Yes Interval: Shift assessment Level of Consciousness (1a.)   : Alert, keenly responsive LOC Questions (1b. )   : Answers both questions correctly LOC Commands (1c. )   : Performs both tasks correctly Best Gaze (2. )  : Normal Visual (3. )  : No visual  loss Facial Palsy (4. )    : Normal symmetrical movements Motor Arm, Left (5a. )   : No drift Motor Arm, Right (5b. ) : No drift Motor Leg, Left (6a. )  : No drift Motor Leg, Right (6b. ) : No drift Limb Ataxia (7. ): Absent Sensory (8. )  : Mild-to-moderate sensory loss, patient feels pinprick is less sharp or is dull on the affected side, or there is a loss of superficial pain with pinprick, but patient is aware of being touched Best Language (9. )  : No aphasia Dysarthria (10. ): Normal Extinction/Inattention (11.)   : No Abnormality Complete NIHSS TOTAL: 1     Neuro Assessment: Exceptions to WDL (patient's daughter reports slurred speech for approx 30 sec. left arm weakness, decreased sensation on left face/left arm/left leg. with balance change.) Neuro Checks:   Initial (03/23/23 1631)  Has TPA been given? No If patient is a Neuro Trauma and patient is going to OR before floor call report to 4N Charge nurse: [phone] or [phone]   R Recommendations: See Admitting Provider Note  Report given to:   Additional Notes:

## 2023-03-24 NOTE — Consult Note (Incomplete)
NEUROLOGY CONSULT NOTE   Date of service: March 24, 2023 Patient Name: Stephanie Tyler MRN:  [id number] DOB:  1966-01-14 Chief Complaint: "Right MCA territory small infarct." Requesting Provider: Tyson Alias, *  History of Present Illness  Nidya Dobbe is a 57 y.o. female with a past medical history of HIV, hypothyroidism, fibromyalgia, hyperlipidemia, hypertension who presents with left arm weakness and numbness.  Symptoms were noted 1430 on 03/23/2023.  She would drop things from her left hand along with left hemibody numbness.  She came into ED for further evaluation workup.  She initially had CT angio head and neck with without contrast which was negative for any acute intracranial abnormalities.  She was transferred to [facility], ED for an MRI brain which demonstrated scattered right MCA distribution infarct.  Neurology was consulted further evaluation workup for the noted right MCA distribution infarcts.  LKW: 1430 on 03/23/2023 Modified rankin score: 0-Completely asymptomatic and back to baseline post- stroke IV Thrombolysis: Not offered as she is outside of window.   EVT: Not offered due to no LVO.     NIHSS components Score: Comment  1a Level of Conscious 0[]  1[]  2[]  3[]      1b LOC Questions 0[]  1[]  2[]       1c LOC Commands 0[]  1[]  2[]       2 Best Gaze 0[]  1[]  2[]       3 Visual 0[]  1[]  2[]  3[]      4 Facial Palsy 0[]  1[]  2[]  3[]      5a Motor Arm - left 0[]  1[]  2[]  3[]  4[]  UN[]    5b Motor Arm - Right 0[]  1[]  2[]  3[]  4[]  UN[]    6a Motor Leg - Left 0[]  1[]  2[]  3[]  4[]  UN[]    6b Motor Leg - Right 0[]  1[]  2[]  3[]  4[]  UN[]    7 Limb Ataxia 0[]  1[]  2[]  3[]  UN[]     8 Sensory 0[]  1[]  2[]  UN[]      9 Best Language 0[]  1[]  2[]  3[]      10 Dysarthria 0[]  1[]  2[]  UN[]      11 Extinct. and Inattention 0[]  1[]  2[]       TOTAL:       ROS  ***Comprehensive ROS performed and pertinent positives documented in HPI  ***Unable to ascertain due to ***  Past History   Past  Medical History:  Diagnosis Date  . Diverticulitis   . Fibromyalgia   . HIV (human immunodeficiency virus infection) (HCC)   . Thyroid disease     Past Surgical History:  Procedure Laterality Date  . COLON SURGERY    . EYE SURGERY    . OVARIAN CYST REMOVAL      Family History: History reviewed. No pertinent family history.  Social History  reports that she has quit smoking. She has never used smokeless tobacco. She reports current alcohol use. She reports that she does not currently use drugs.  Allergies  Allergen Reactions  . Elemental Sulfur Other (See Comments)  . Sulfamethoxazole Other (See Comments)    Unknown to pt    Medications   Current Facility-Administered Medications:  .  acetaminophen (TYLENOL) tablet 650 mg, 650 mg, Oral, Q6H PRN, 650 mg at 03/23/23 2332 **OR** acetaminophen (TYLENOL) suppository 650 mg, 650 mg, Rectal, Q6H PRN, Morene Crocker, MD .  enoxaparin (LOVENOX) injection 40 mg, 40 mg, Subcutaneous, QHS, Morene Crocker, MD, 40 mg at 03/23/23 2333 .  levothyroxine (SYNTHROID) tablet 100 mcg, 100 mcg, Oral, QAC breakfast, Gomez-Caraballo, Maria, MD .  LORazepam (ATIVAN) injection 1 mg,  1 mg, Intravenous, Once PRN, Morene Crocker, MD .  senna-docusate (Senokot-S) tablet 1 tablet, 1 tablet, Oral, QHS PRN, Morene Crocker, MD .  sodium chloride flush (NS) 0.9 % injection 3 mL, 3 mL, Intravenous, Once, Morene Crocker, MD  Current Outpatient Medications:  .  albuterol (VENTOLIN HFA) 108 (90 Base) MCG/ACT inhaler, Inhale 1 puff into the lungs every 6 (six) hours as needed for wheezing or shortness of breath., Disp: , Rfl:  .  budesonide-formoterol (SYMBICORT) 160-4.5 MCG/ACT inhaler, Inhale 2 puffs into the lungs 2 (two) times daily., Disp: , Rfl:  .  citalopram (CELEXA) 40 MG tablet, Take 40 mg by mouth daily., Disp: , Rfl:  .  dolutegravir-rilpivirine (JULUCA) 50-25 MG tablet, Take 1 tablet by mouth daily before lunch.,  Disp: , Rfl:  .  DULoxetine (CYMBALTA) 60 MG capsule, Take 60 mg by mouth daily., Disp: , Rfl:  .  gabapentin (NEURONTIN) 600 MG tablet, Take 2 tablets by mouth 2 (two) times daily., Disp: , Rfl:  .  hydrOXYzine (ATARAX) 25 MG tablet, Take 25 mg by mouth daily., Disp: , Rfl:  .  levothyroxine (SYNTHROID) 100 MCG tablet, Take 100 mcg by mouth daily before breakfast., Disp: , Rfl:  .  Lifitegrast (XIIDRA) 5 % SOLN, Place 1 drop into both eyes 2 (two) times daily., Disp: , Rfl:  .  lisinopril (ZESTRIL) 20 MG tablet, Take 20 mg by mouth daily., Disp: , Rfl:  .  metroNIDAZOLE (FLAGYL) 250 MG tablet, Take 250 mg by mouth 3 (three) times daily., Disp: , Rfl:  .  ondansetron (ZOFRAN ODT) 4 MG disintegrating tablet, Take 1 tablet (4 mg total) by mouth every 8 (eight) hours as needed for nausea or vomiting., Disp: 20 tablet, Rfl: 0 .  pravastatin (PRAVACHOL) 40 MG tablet, Take 40 mg by mouth daily., Disp: , Rfl:   Vitals   Vitals:   2023-03-30 1620 03/30/2023 1700 30-Mar-2023 1715 March 30, 2023 2114  BP: (!) 147/93 (!) 137/91  (!) 150/99  Pulse: 78  65 66  Resp: 16  18 17   Temp: 98.9 F (37.2 C)   98.1 F (36.7 C)  TempSrc: Oral   Oral  SpO2: 96%  98% 100%  Weight:        Body mass index is 39.3 kg/m.  Physical Exam   Constitutional: Appears well-developed and well-nourished. *** Psych: Affect appropriate to situation. *** Eyes: No scleral injection. *** HENT: No OP obstruction. *** Head: Normocephalic. *** Cardiovascular: Normal rate and regular rhythm. *** Respiratory: Effort normal, non-labored breathing. *** GI: Soft.  No distension. There is no tenderness. *** Skin: WDI. ***  Neurologic Examination   ***  Labs/Imaging/Neurodiagnostic studies   CBC:  Recent Labs  Lab March 30, 2023 1627  WBC 8.1  NEUTROABS 5.3  HGB 14.2  HCT 41.7  MCV 93.1  PLT 316   Basic Metabolic Panel:  Lab Results  Component Value Date   NA 137 03-30-2023   K 3.6 03-30-2023   CO2 24 03/30/23   GLUCOSE  121 (H) March 30, 2023   BUN 8 03/30/2023   CREATININE 0.82 Mar 30, 2023   CALCIUM 9.3 2023-03-30   GFRNONAA >60 2023-03-30   Lipid Panel: No results found for: "LDLCALC" HgbA1c: No results found for: "HGBA1C" Urine Drug Screen: No results found for: "LABOPIA", "COCAINSCRNUR", "LABBENZ", "AMPHETMU", "THCU", "LABBARB"  Alcohol Level     Component Value Date/Time   ETH <10 03/30/2023 1627   INR  Lab Results  Component Value Date   INR 0.9 30-Mar-2023   APTT  Lab Results  Component Value Date   APTT 25 03/23/2023   AED levels: No results found for: "PHENYTOIN", "ZONISAMIDE", "LAMOTRIGINE", "LEVETIRACETA"  CT Head without contrast(Personally reviewed): ***  CT angio Head and Neck with contrast(Personally reviewed): ***  MR Angio head without contrast and Carotid Duplex BL(Personally reviewed): ***  MRI Brain(Personally reviewed): ***  Neurodiagnostics rEEG:  ***  ASSESSMENT   Jaleyah Rosengrant is a 57 y.o. female  has a past medical history of Diverticulitis, Fibromyalgia, HIV (human immunodeficiency virus infection) (HCC), and Thyroid disease. ***  RECOMMENDATIONS  *** ______________________________________________________________________    Welton Flakes, MD [facility]

## 2023-03-24 NOTE — Progress Notes (Signed)
   03/24/23 2208  BiPAP/CPAP/SIPAP  $ Non-Invasive Ventilator  Non-Invasive Vent Initial  $ Face Mask Medium Yes  BiPAP/CPAP/SIPAP Pt Type Adult  BiPAP/CPAP/SIPAP Resmed  Mask Type Nasal mask  Mask Size Medium  PEEP 5 cmH20  FiO2 (%) 21 %  Leak 21  Patient Home Equipment No  Auto Titrate No  Press High Alarm 40 cmH2O  CPAP/SIPAP surface wiped down Yes

## 2023-03-24 NOTE — Evaluation (Addendum)
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 PMH includes HIV, hypothyroidism   Clinical Impression   Pt reports ind at baseline with ADL/functional mobility, lives with daughter in 2nd floor apartment and works in home health care. Pt currently needing supervision for ADLs, mod I for bed mobility and transfers without AD. Pt with L sided deficits, decr sensation to LUE and vision changes to L eye. Educated pt on desensitization strategies for LUE. Pt presenting with impairments listed below, will follow acutely. Recommend OP OT at d/c.        If plan is discharge home, recommend the following: A little help with walking and/or transfers;A little help with bathing/dressing/bathroom;Assistance with cooking/housework;Assist for transportation;Help with stairs or ramp for entrance;Supervision due to cognitive status    Functional Status Assessment  Patient has had a recent decline in their functional status and demonstrates the ability to make significant improvements in function in a reasonable and predictable amount of time.  Equipment Recommendations  None recommended by OT    Recommendations for Other Services PT consult     Precautions / Restrictions Restrictions Weight Bearing Restrictions Per Provider Order: No      Mobility Bed Mobility Overal bed mobility: Modified Independent                  Transfers Overall transfer level: Modified independent                        Balance Overall balance assessment: Mild deficits observed, not formally tested                                         ADL either performed or assessed with clinical judgement   ADL  Overall ADL's : Needs assistance/impaired     Grooming: Wash/dry hands;Standing;Supervision/safety   Upper Body Bathing: Supervision/ safety   Lower Body Bathing: Supervison/ safety   Upper Body Dressing : Supervision/safety   Lower Body Dressing: Supervision/safety   Toilet Transfer: Retail banker;Ambulation   Toileting- Clothing Manipulation and Hygiene: Supervision/safety               Vision Baseline Vision/History: 1 Wears glasses Vision Assessment?: Vision impaired- to be further tested in functional context;Wears glasses for reading Additional Comments: L eyelid mild ptosis, reports some double vision in L eye with R eye occluded     Perception Perception: Within Functional Limits       Praxis Praxis: WFL       Pertinent Vitals/Pain Pain Assessment Pain Assessment: No/denies pain     Extremity/Trunk Assessment Upper Extremity Assessment Upper Extremity Assessment: Right hand dominant;LUE deficits/detail LUE Deficits / Details: 4/5, mild weakness and decr coordination, reports decr sensation throughout LUE Sensation: decreased light touch;decreased proprioception LUE Coordination: decreased fine motor   Lower Extremity Assessment Lower Extremity Assessment: Defer to PT evaluation   Cervical / Trunk Assessment Cervical / Trunk Assessment: Normal   Communication Communication Communication: No apparent difficulties   Cognition Arousal: Alert Behavior During Therapy: WFL for tasks assessed/performed Overall Cognitive Status: Within Functional Limits for tasks assessed  General Comments: incr time to count backwards, decr STM, 3 errors with SBT     General Comments  VSS    Exercises     Shoulder Instructions      Home Living Family/patient expects to be discharged to:: Private residence Living Arrangements: Children (daughter) Available Help at Discharge: Family;Available 24  hours/day Type of Home: Apartment (3rd floor apt) Home Access: Stairs to enter Entrance Stairs-Number of Steps: 1 flight Entrance Stairs-Rails: Can reach both Home Layout: One level     Bathroom Shower/Tub: Walk-in shower         Home Equipment: None          Prior Functioning/Environment Prior Level of Function : Independent/Modified Independent;Driving;Working/employed               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        OT Problem List: Decreased strength;Decreased range of motion;Decreased activity tolerance;Impaired balance (sitting and/or standing);Impaired vision/perception;Decreased cognition;Impaired UE functional use;Impaired sensation      OT Treatment/Interventions: Self-care/ADL training;Therapeutic exercise;Energy conservation;DME and/or AE instruction;Therapeutic activities;Visual/perceptual remediation/compensation;Cognitive remediation/compensation;Neuromuscular education;Balance training;Patient/family education    OT Goals(Current goals can be found in the care plan section) Acute Rehab OT Goals Patient Stated Goal: none stated OT Goal Formulation: With patient Time For Goal Achievement: 04/07/23 Potential to Achieve Goals: Good ADL Goals Pt Will Perform Tub/Shower Transfer: Shower transfer;with modified independence;ambulating Pt/caregiver will Perform Home Exercise Program: Left upper extremity;Increased ROM;Increased strength;With written HEP provided;With Supervision Additional ADL Goal #1: pt will verbalize x3 sensory desensitization techniques  OT Frequency: Min 1X/week    Co-evaluation              AM-PAC OT "6 Clicks" Daily Activity     Outcome Measure Help from another person eating meals?: None Help from another person taking care of personal grooming?: A Little Help from another person toileting, which includes using toliet, bedpan, or urinal?: A Little Help from another person bathing (including washing, rinsing,  drying)?: A Little Help from another person to put on and taking off regular upper body clothing?: A Little Help from another person to put on and taking off regular lower body clothing?: A Little 6 Click Score: 19   End of Session Nurse Communication: Mobility status  Activity Tolerance: Patient tolerated treatment well Patient left: in bed;with call bell/phone within reach  OT Visit Diagnosis: Unsteadiness on feet (R26.81);Other abnormalities of gait and mobility (R26.89);Muscle weakness (generalized) (M62.81);Other symptoms and signs involving the nervous system (R29.898)                Time: 1610-9604 OT Time Calculation (min): 26 min Charges:  OT General Charges $OT Visit: 1 Visit OT Evaluation $OT Eval Low Complexity: 1 Low OT Treatments $Self Care/Home Management : 8-22 mins  Carver Fila, OTD, OTR/L SecureChat Preferred Acute Rehab [phone]   Myrl Bynum K Koonce 03/24/2023, 1:15 PM

## 2023-03-24 NOTE — ED Notes (Signed)
Patient to room after MRI.   Patient presents POV from [facility] for MRI after having left sided weakness and episode of confusion on 12/19 at 6pm. Patient also endorses headache ongoing for 2-3 days. Patient alert + oriented on arrival with no weakness noted. She does report continued sensory changes on the left side of her body but states it has improved some since earlier today. Daughter at bedside.

## 2023-03-24 NOTE — Consult Note (Signed)
NEUROLOGY CONSULT NOTE   Date of service: March 24, 2023 Patient Name: Stephanie Tyler MRN:  [id number] DOB:  1966-03-06 Chief Complaint: "Right MCA territory small infarct." Requesting Provider: Tyson Alias, *  History of Present Illness  Stephanie Tyler is a 57 y.o. female with a past medical history of HIV, hypothyroidism, fibromyalgia, hyperlipidemia, hypertension who presents with left arm weakness and numbness.  Symptoms were noted 1430 on 03/23/2023.  She would drop things from her left hand along with left hemibody numbness.  She came into ED for further evaluation workup.  She initially had CT angio head and neck with without contrast which was negative for any acute intracranial abnormalities.  She was transferred to [facility], ED for an MRI brain which demonstrated scattered right MCA distribution infarct.  Neurology was consulted further evaluation workup for the noted right MCA distribution infarcts.  LKW: 1430 on 03/23/2023 Modified rankin score: 0-Completely asymptomatic and back to baseline post- stroke IV Thrombolysis: Not offered as she is outside of window.   EVT: Not offered due to no LVO.     NIHSS components Score: Comment  1a Level of Conscious 0[x]  1[]  2[]  3[]      1b LOC Questions 0[x]  1[]  2[]       1c LOC Commands 0[x]  1[]  2[]       2 Best Gaze 0[x]  1[]  2[]       3 Visual 0[x]  1[]  2[]  3[]      4 Facial Palsy 0[x]  1[]  2[]  3[]      5a Motor Arm - left 0[x]  1[]  2[]  3[]  4[]  UN[]    5b Motor Arm - Right 0[x]  1[]  2[]  3[]  4[]  UN[]    6a Motor Leg - Left 0[]  1[x]  2[]  3[]  4[]  UN[]    6b Motor Leg - Right 0[x]  1[]  2[]  3[]  4[]  UN[]    7 Limb Ataxia 0[]  1[x]  2[]  3[]  UN[]     8 Sensory 0[x]  1[]  2[]  UN[]      9 Best Language 0[x]  1[]  2[]  3[]      10 Dysarthria 0[]  1[]  2[]  UN[]      11 Extinct. and Inattention 0[x]  1[]  2[]       TOTAL: 2      ROS  Comprehensive ROS performed and pertinent positives documented in HPI   Past History   Past Medical History:   Diagnosis Date   Diverticulitis    Fibromyalgia    HIV (human immunodeficiency virus infection) (HCC)    Thyroid disease     Past Surgical History:  Procedure Laterality Date   COLON SURGERY     EYE SURGERY     OVARIAN CYST REMOVAL      Family History: History reviewed. No pertinent family history.  Social History  reports that she has quit smoking. She has never used smokeless tobacco. She reports current alcohol use. She reports that she does not currently use drugs.  Allergies  Allergen Reactions   Elemental Sulfur Other (See Comments)   Sulfamethoxazole Other (See Comments)    Unknown to pt    Medications   Current Facility-Administered Medications:    acetaminophen (TYLENOL) tablet 650 mg, 650 mg, Oral, Q6H PRN, 650 mg at 03/23/23 2332 **OR** acetaminophen (TYLENOL) suppository 650 mg, 650 mg, Rectal, Q6H PRN, Morene Crocker, MD   enoxaparin (LOVENOX) injection 40 mg, 40 mg, Subcutaneous, QHS, Gomez-Caraballo, Maria, MD, 40 mg at 03/23/23 2333   levothyroxine (SYNTHROID) tablet 100 mcg, 100 mcg, Oral, QAC breakfast, Gomez-Caraballo, Maria, MD   LORazepam (ATIVAN) injection 1 mg, 1 mg, Intravenous, Once PRN, Gomez-Caraballo,  Byrd Hesselbach, MD   senna-docusate (Senokot-S) tablet 1 tablet, 1 tablet, Oral, QHS PRN, Morene Crocker, MD   sodium chloride flush (NS) 0.9 % injection 3 mL, 3 mL, Intravenous, Once, Morene Crocker, MD  Current Outpatient Medications:    albuterol (VENTOLIN HFA) 108 (90 Base) MCG/ACT inhaler, Inhale 1 puff into the lungs every 6 (six) hours as needed for wheezing or shortness of breath., Disp: , Rfl:    budesonide-formoterol (SYMBICORT) 160-4.5 MCG/ACT inhaler, Inhale 2 puffs into the lungs 2 (two) times daily., Disp: , Rfl:    citalopram (CELEXA) 40 MG tablet, Take 40 mg by mouth daily., Disp: , Rfl:    dolutegravir-rilpivirine (JULUCA) 50-25 MG tablet, Take 1 tablet by mouth daily before lunch., Disp: , Rfl:    DULoxetine  (CYMBALTA) 60 MG capsule, Take 60 mg by mouth daily., Disp: , Rfl:    gabapentin (NEURONTIN) 600 MG tablet, Take 2 tablets by mouth 2 (two) times daily., Disp: , Rfl:    hydrOXYzine (ATARAX) 25 MG tablet, Take 25 mg by mouth daily., Disp: , Rfl:    levothyroxine (SYNTHROID) 100 MCG tablet, Take 100 mcg by mouth daily before breakfast., Disp: , Rfl:    Lifitegrast (XIIDRA) 5 % SOLN, Place 1 drop into both eyes 2 (two) times daily., Disp: , Rfl:    lisinopril (ZESTRIL) 20 MG tablet, Take 20 mg by mouth daily., Disp: , Rfl:    metroNIDAZOLE (FLAGYL) 250 MG tablet, Take 250 mg by mouth 3 (three) times daily., Disp: , Rfl:    ondansetron (ZOFRAN ODT) 4 MG disintegrating tablet, Take 1 tablet (4 mg total) by mouth every 8 (eight) hours as needed for nausea or vomiting., Disp: 20 tablet, Rfl: 0   pravastatin (PRAVACHOL) 40 MG tablet, Take 40 mg by mouth daily., Disp: , Rfl:   Vitals   Vitals:   03/23/23 1620 03/23/23 1700 03/23/23 1715 03/23/23 2114  BP: (!) 147/93 (!) 137/91  (!) 150/99  Pulse: 78  65 66  Resp: 16  18 17   Temp: 98.9 F (37.2 C)   98.1 F (36.7 C)  TempSrc: Oral   Oral  SpO2: 96%  98% 100%  Weight:        Body mass index is 39.3 kg/m.  Physical Exam   General: Laying comfortably in bed; in no acute distress.  HENT: Normal oropharynx and mucosa. Normal external appearance of ears and nose.  Neck: Supple, no pain or tenderness  CV: No JVD. No peripheral edema.  Pulmonary: Symmetric Chest rise. Normal respiratory effort.  Abdomen: Soft to touch, non-tender.  Ext: No cyanosis, edema, or deformity  Skin: No rash. Normal palpation of skin.   Musculoskeletal: Normal digits and nails by inspection. No clubbing.   Neurologic Examination  Mental status/Cognition: Alert, oriented to self, place, month and year, good attention.  Speech/language: Fluent, comprehension intact, object naming intact, repetition intact.  Cranial nerves:   CN II Pupils equal and reactive to light,  no VF deficits    CN III,IV,VI EOM intact, no gaze preference or deviation, no nystagmus    CN V normal sensation in V1, V2, and V3 segments bilaterally    CN VII no asymmetry, no nasolabial fold flattening    CN VIII normal hearing to speech    CN IX & X normal palatal elevation, no uvular deviation    CN XI 5/5 head turn and 5/5 shoulder shrug bilaterally    CN XII midline tongue protrusion    Motor:  Muscle bulk: normal,  tone normal, pronator drift none tremor none Mvmt Root Nerve  Muscle Right Left Comments  SA C5/6 Ax Deltoid 5 5   EF C5/6 Mc Biceps 5 4   EE C6/7/8 Rad Triceps 5 4   WF C6/7 Med FCR     WE C7/8 PIN ECU     F Ab C8/T1 U ADM/FDI 5 4   HF L1/2/3 Fem Illopsoas 5 4   KE L2/3/4 Fem Quad 5 4   DF L4/5 D Peron Tib Ant 5 5   PF S1/2 Tibial Grc/Sol 5 5    Sensation:  Light touch Intact trhroughout   Pin prick    Temperature    Vibration   Proprioception    Coordination/Complex Motor:  - Finger to Nose ataxia in LUE - Heel to shin intact BL - Rapid alternating movement are slowed on the left - Gait: deferred.   Labs/Imaging/Neurodiagnostic studies   CBC:  Recent Labs  Lab 07-Apr-2023 1627  WBC 8.1  NEUTROABS 5.3  HGB 14.2  HCT 41.7  MCV 93.1  PLT 316   Basic Metabolic Panel:  Lab Results  Component Value Date   NA 137 April 07, 2023   K 3.6 April 07, 2023   CO2 24 07-Apr-2023   GLUCOSE 121 (H) 2023-04-07   BUN 8 04/07/2023   CREATININE 0.82 04-07-23   CALCIUM 9.3 April 07, 2023   GFRNONAA >60 04-07-2023   Lipid Panel: No results found for: "LDLCALC" HgbA1c: No results found for: "HGBA1C" Urine Drug Screen: No results found for: "LABOPIA", "COCAINSCRNUR", "LABBENZ", "AMPHETMU", "THCU", "LABBARB"  Alcohol Level     Component Value Date/Time   ETH <10 04/07/2023 1627   INR  Lab Results  Component Value Date   INR 0.9 04/07/2023   APTT  Lab Results  Component Value Date   APTT 25 Apr 07, 2023   AED levels: No results found for: "PHENYTOIN",  "ZONISAMIDE", "LAMOTRIGINE", "LEVETIRACETA"  CT Head without contrast(Personally reviewed): CTH was negative for a large hypodensity concerning for a large territory infarct or hyperdensity concerning for an ICH  CT angio Head and Neck with contrast(Personally reviewed): No LVO  MRI Brain(Personally reviewed): Scattered R MCA stroke  ASSESSMENT   Stephanie Tyler is a 57 y.o. female with a past medical history of HIV, hypothyroidism, fibromyalgia, hyperlipidemia, hypertension who presents with left arm weakness and numbness. She was found to have scattered R MCA stroke.  Etiology unclear, appears embolic.  RECOMMENDATIONS  - Frequent Neuro checks per stroke unit protocol - Recommend obtaining TTE  - Recommend obtaining Lipid panel with LDL - Please start statin if LDL > 70 - Recommend HbA1c to evaluate for diabetes and how well it is controlled. - Antithrombotic - aspirin 81mg  daily along with plavix 75mg  daily x 21 days, followed by Aspirin 81mg  daily alone. - Recommend DVT ppx - SBP goal - permissive hypertension first 24 h < 220/110. Held home meds.  - Recommend Telemetry monitoring for arrythmia - Recommend bedside swallow screen prior to PO intake. - Stroke education booklet - Recommend PT/OT/SLP consult - Recommend Urine Tox screen.  ______________________________________________________________________    Welton Flakes, MD [facility]

## 2023-03-24 NOTE — Progress Notes (Addendum)
OCEANIC STROKE RESEARCH STUDY NOTE   Patient is participating in  [place] stroke prevention study ( standard of care antiplatelet therapy plus Asundexian-factor 11 inhibitor versus standard of care antiplatelet therapy plus placebo ).  Patient was given study material to review and informed consent form at 9:50 AM.  Patient was advised to take as much time as she wanted to review the consent form and encouraged to ask questions which were answered.  Patient's daughter was also present for the phone and was also encouraged to review the material and ask questions.  It was made clear to the patient that study participation is voluntary and patient will still get the same excellent standard of care irrespective of whether she chooses to participate in the study or not.  The benefits of participation in the study as well as the risk involved including but not limited to bleeding as well as alternatives to not participating in the study were clearly discussed with the patient and family who expressed understanding.  The patient was clearly informed that patient has no obligation to's stay in the study for the entire duration and is free to discontinue study medication or study participation if she is dissatisfied at any time in the future.  The research study office visit scheduled was explained to the patient and study obligations were clearly explained as well.  Patient voiced understanding and willingness to participate in the study.  The study inclusion exclusion criteria reviewed by me personally as well as study coordinator and verified that patient met all criteria.  Patient signed informed consent in my presence at 1340.  The patient has history of HIV and takes medications but I specifically asked the pharmacist on call to check for interaction between study medication and Juluca that she is taking for HIV and was told these 2 medications do not significantly interact by pharmacist on-call.  The hospital  research pharmacy was notified in advance about potential patient participation and after patient randomization and patient received first dose of medication before discharge and she was discharged home with the study medication bottle and instructed to call [facility] research office with any questions.  No study specific procedure was done prior to patient signing informed consent form.  A copy of informed consent form and patient Annette Stable of Rights signed by the patient was given to her.  Delia Heady, MD

## 2023-03-24 NOTE — Care Management Obs Status (Signed)
MEDICARE OBSERVATION STATUS NOTIFICATION   Patient Details  Name: Cyani Space MRN: [id number] Date of Birth: 1966-01-25   Medicare Observation Status Notification Given:  Yes    Lawerance Sabal, RN 03/24/2023, 9:59 AM

## 2023-03-24 NOTE — Plan of Care (Signed)
Transcranial doppler done. [facility], neuro checks.   Problem: Education: Goal: Knowledge of General Education information will improve Description: Including pain rating scale, medication(s)/side effects and non-pharmacologic comfort measures Outcome: Not Met (add Reason)   Problem: Health Behavior/Discharge Planning: Goal: Ability to manage health-related needs will improve Outcome: Not Met (add Reason)   Problem: Clinical Measurements: Goal: Ability to maintain clinical measurements within normal limits will improve Outcome: Not Met (add Reason) Goal: Will remain free from infection Outcome: Not Met (add Reason) Goal: Diagnostic test results will improve Outcome: Not Met (add Reason) Goal: Respiratory complications will improve Outcome: Not Met (add Reason) Goal: Cardiovascular complication will be avoided Outcome: Not Met (add Reason)   Problem: Activity: Goal: Risk for activity intolerance will decrease Outcome: Not Met (add Reason)   Problem: Nutrition: Goal: Adequate nutrition will be maintained Outcome: Not Met (add Reason)   Problem: Coping: Goal: Level of anxiety will decrease Outcome: Not Met (add Reason)   Problem: Elimination: Goal: Will not experience complications related to bowel motility Outcome: Not Met (add Reason) Goal: Will not experience complications related to urinary retention Outcome: Not Met (add Reason)   Problem: Pain Management: Goal: General experience of comfort will improve Outcome: Not Met (add Reason)   Problem: Safety: Goal: Ability to remain free from injury will improve Outcome: Not Met (add Reason)   Problem: Skin Integrity: Goal: Risk for impaired skin integrity will decrease Outcome: Not Met (add Reason)

## 2023-03-24 NOTE — Progress Notes (Signed)
HD#0 SUBJECTIVE:  Patient Summary: Stephanie Tyler is a 57 y.o. female with a pertinent PMH HTN, HLD, well-controlled HIV, who presented with acute left arm weakness and numbness and is admitted for an acute right MCA stroke.  Overnight Events: None  Interim History: She states she feels better this morning. She is able to move her left arm more and feels that her left-sided facial numbness is improving. She also says she is able to grip and hold things more than yesterday. She denies any new symptoms overnight.   OBJECTIVE:  Vital Signs: Vitals:   03/24/23 0508 03/24/23 0806 03/24/23 0837 03/24/23 1300  BP:  (!) 149/98  (!) 148/91  Pulse:  72  72  Resp:    16  Temp:  98.3 F (36.8 C)  98 F (36.7 C)  TempSrc:  Oral  Oral  SpO2:  100% 99% 100%  Weight:      Height: 5\' 1"  (1.549 m)      Supplemental O2: Room Air SpO2: 100 %  Filed Weights   03/23/23 1619  Weight: 94.3 kg   Physical Exam: General: well-appearing, sitting in bed in no acute distress Cardiac: regular rate and rhythm, no murmur appreciated Pulmonary: normal rate and effort Neuro: Mild facial droop on the left. Tongue midline. Left-sided facial numbness. No other CN deficits appreciated. Grip strength on the left 4/5, biceps and triceps strength on the left 4/5. Right-sided grip and strength 5/5 throughout. Bilateral deltoid and shoulder strength 5/5. RLE strength 5/5. LLE strength 3/5 throughout. Sensation diminished in LUE and LLE throughout.  Psych: Normal mood and affect  Patient Lines/Drains/Airways Status     Active Line/Drains/Airways     Name Placement date Placement time Site Days   Peripheral IV 03/23/23 18 G Right Antecubital 03/23/23  1634  Antecubital  1           Pertinent Labs:    Latest Ref Rng & Units 03/24/2023    5:31 AM 03/23/2023    4:27 PM 08/29/2020    4:03 PM  CBC  WBC 4.0 - 10.5 K/uL 9.8  8.1  15.4   Hemoglobin 12.0 - 15.0 g/dL 78.4  69.6  29.5   Hematocrit 36.0 - 46.0 %  39.2  41.7  47.7   Platelets 150 - 400 K/uL 294  316  368       Latest Ref Rng & Units 03/24/2023    5:31 AM 03/23/2023   11:32 PM 03/23/2023    4:27 PM  CMP  Glucose 70 - 99 mg/dL 284   132   BUN 6 - 20 mg/dL 7   8   Creatinine 4.40 - 1.00 mg/dL 1.02  7.25  3.66   Sodium 135 - 145 mmol/L 140   137   Potassium 3.5 - 5.1 mmol/L 3.7   3.6   Chloride 98 - 111 mmol/L 105   106   CO2 22 - 32 mmol/L 27   24   Calcium 8.9 - 10.3 mg/dL 9.1   9.3   Total Protein 6.5 - 8.1 g/dL   7.5   Total Bilirubin <1.2 mg/dL   0.8   Alkaline Phos 38 - 126 U/L   72   AST 15 - 41 U/L   25   ALT 0 - 44 U/L   20     Recent Labs    03/23/23 1635 03/24/23 0913  GLUCAP 127* 136*     Pertinent Imaging: MR Cervical Spine Wo Contrast Result Date:  03/23/2023 CLINICAL DATA:  Ataxia EXAM: MRI CERVICAL SPINE WITHOUT CONTRAST TECHNIQUE: Multiplanar, multisequence MR imaging of the cervical spine was performed. No intravenous contrast was administered. COMPARISON:  10/10/2021 FINDINGS: Alignment: Physiologic. Vertebrae: No fracture, evidence of discitis, or bone lesion. Cord: Normal signal and morphology. Posterior Fossa, vertebral arteries, paraspinal tissues: Unchanged cystic focus at the inferior right thyroid. No follow-up imaging required. Disc levels: C1-2: Unremarkable. C2-3: Normal disc space and facet joints. There is no spinal canal stenosis. No neural foraminal stenosis. C3-4: Unchanged small central disc protrusion. There is no spinal canal stenosis. No neural foraminal stenosis. C4-5: Minimal disc bulge. There is no spinal canal stenosis. No neural foraminal stenosis. C5-6: Small right subarticular disc protrusion. There is no spinal canal stenosis. No neural foraminal stenosis. C6-7: Tiny left subarticular disc protrusion. There is no spinal canal stenosis. No neural foraminal stenosis. C7-T1: Small left foraminal protrusion. There is no spinal canal stenosis. Mild left neural foraminal stenosis.  IMPRESSION: 1. No acute abnormality of the cervical spine. 2. Mild left C7-T1 neural foraminal stenosis. 3. No spinal canal stenosis. Electronically Signed   By: Deatra Robinson M.D.   On: 03/23/2023 20:56   MR BRAIN WO CONTRAST Result Date: 03/23/2023 CLINICAL DATA:  Acute neurologic deficit EXAM: MRI HEAD WITHOUT CONTRAST TECHNIQUE: Multiplanar, multiecho pulse sequences of the brain and surrounding structures were obtained without intravenous contrast. COMPARISON:  02/08/2016 FINDINGS: Brain: Multifocal abnormal diffusion restriction in the right MCA territory, affecting the posterior insula and frontal operculum. No acute or chronic hemorrhage. Normal white matter signal, parenchymal volume and CSF spaces. The midline structures are normal. Vascular: Normal flow voids. Skull and upper cervical spine: Normal calvarium and skull base. Visualized upper cervical spine and soft tissues are normal. Sinuses/Orbits:No paranasal sinus fluid levels or advanced mucosal thickening. No mastoid or middle ear effusion. Normal orbits. 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 No hemorrhage or mass effect. Electronically Signed   By: Deatra Robinson M.D.   On: 03/23/2023 20:41   CT ANGIO HEAD NECK W WO CM Result Date: 03/23/2023 CLINICAL DATA:  Neuro deficit, acute, stroke suspected L arm weakness r/o stroke EXAM: CT ANGIOGRAPHY HEAD AND NECK WITH AND WITHOUT CONTRAST TECHNIQUE: Multidetector CT imaging of the head and neck was performed using the standard protocol during bolus administration of intravenous contrast. Multiplanar CT image reconstructions and MIPs were obtained to evaluate the vascular anatomy. Carotid stenosis measurements (when applicable) are obtained utilizing NASCET criteria, using the distal internal carotid diameter as the denominator. RADIATION DOSE REDUCTION: This exam was performed according to the departmental dose-optimization  program which includes automated exposure control, adjustment of the mA and/or kV according to patient size and/or use of iterative reconstruction technique. CONTRAST:  75mL OMNIPAQUE IOHEXOL 350 MG/ML SOLN COMPARISON:  None Available. FINDINGS: CT HEAD FINDINGS Brain: No evidence of acute large vascular territory infarction, hemorrhage, hydrocephalus, extra-axial collection or mass lesion/mass effect. Vascular: See below. Skull: No acute fracture. Sinuses/Orbits: No acute finding. Other: No mastoid effusions. Review of the MIP images confirms the above findings CTA NECK FINDINGS Aortic arch: Great vessel origins are patent without significant stenosis. Right carotid system: No evidence of dissection, stenosis (50% or greater), or occlusion. Retropharyngeal course. Left carotid system: No evidence of dissection, stenosis (50% or greater), or occlusion. Vertebral arteries: Left dominant. No evidence of dissection, stenosis (50% or greater), or occlusion. Skeleton: No acute abnormality on limited assessment. Other neck: Right thyroid nodule better characterized  on prior ultrasound. Upper chest: Visualized lung apices are clear. Review of the MIP images confirms the above findings CTA HEAD FINDINGS Anterior circulation: Bilateral intracranial ICAs, MCAs, and ACAs are patent without proximal hemodynamically significant stenosis. Posterior circulation: Bilateral intradural vertebral arteries, basilar artery and bilateral posterior cerebral arteries are patent without proximal hemodynamically significant stenosis. Venous sinuses: As permitted by contrast timing, patent. Review of the MIP images confirms the above findings IMPRESSION: 1. No evidence of acute intracranial abnormality. MRI could provide more sensitive evaluation for acute infarct if clinically warranted. 2. No emergent large vessel occlusion or proximal hemodynamically significant stenosis. Electronically Signed   By: Feliberto Harts M.D.   On: 03/23/2023  18:49    ASSESSMENT/PLAN:  Assessment: Principal Problem:   Acute cerebral infarction (HCC) Active Problems:   HLD (hyperlipidemia)   Human immunodeficiency virus (HIV) disease (HCC)   OSA on CPAP   Essential hypertension  Juanna Feilen is a 56 year old person with a pertinent PMH of HTN, HLD, well-controlled HIV, who presented with acute left arm weakness and numbness and is admitted for an acute right MCA stroke on hospital day 1.  Plan: Acute right MCA infarcts, likely embolic Demonstrated on MRI on 12/20, affecting the posterior insula and frontal operculum. She continues to have left-sided deficits, but they are slightly improved from last exam. Weakness is greater in distal LUE and LLE than proximally. Her sensory deficits on her face are slightly improved. No new focal neurological findings were noted.  - Neurology following; appreciate assistance - DAPT for 3 weeks, followed by aspirin alone indefinitely - TTE today - PT/OT/SLP eval - LDL 83, above goal <70; pravastatin changed to rosuvastatin 40 mg daily  Chronic conditions: HTN: continue Lisinopril 20 mg daily, goal BP <220/110 HLD: pravastatin 40 mg daily changed to rosuvastatin 40 mg daily Fibromyalgia: continue Citalopram 40 mg, duloxetine 60 mg and Gabapentin 098 mg TID Lichen planus: continue metronidazole Hypothyroidism: continue synthroid 100 mcg Well-controlled HIV: continue Juluca 50-25 mg  OSA: CPAP at night Allergy-induced asthma: continue albuterol, breo ellipta  Best Practice: Diet: Healthy heart IVF: Fluids: None VTE: enoxaparin (LOVENOX) injection 40 mg Start: 03/23/23 2215 SCDs Start: 03/23/23 2200 Code: Full AB: None Therapy Recs: Pending DISPO: Anticipated discharge in 1-3 days to pending further workup, PT/OT eval.  Signature: Annett Fabian, MD  Internal Medicine Resident, PGY-1 [facility]  Pager: [phone] 1:46 PM, 03/24/2023   Please contact the on  call pager after 5 pm and on weekends at [phone].

## 2023-03-25 DIAGNOSIS — I10 Essential (primary) hypertension: Secondary | ICD-10-CM | POA: Diagnosis not present

## 2023-03-25 DIAGNOSIS — B20 Human immunodeficiency virus [HIV] disease: Secondary | ICD-10-CM | POA: Diagnosis not present

## 2023-03-25 DIAGNOSIS — I5020 Unspecified systolic (congestive) heart failure: Secondary | ICD-10-CM | POA: Diagnosis present

## 2023-03-25 DIAGNOSIS — I63311 Cerebral infarction due to thrombosis of right middle cerebral artery: Secondary | ICD-10-CM | POA: Diagnosis not present

## 2023-03-25 DIAGNOSIS — E785 Hyperlipidemia, unspecified: Secondary | ICD-10-CM | POA: Diagnosis not present

## 2023-03-25 DIAGNOSIS — I639 Cerebral infarction, unspecified: Secondary | ICD-10-CM | POA: Diagnosis not present

## 2023-03-25 LAB — GLUCOSE, CAPILLARY: Glucose-Capillary: 136 mg/dL — ABNORMAL HIGH (ref 70–99)

## 2023-03-25 MED ORDER — CLOPIDOGREL BISULFATE 75 MG PO TABS: 75.0000 mg | ORAL_TABLET | Freq: Every day | ORAL | 0 refills | Status: AC

## 2023-03-25 MED ORDER — STUDY - OCEANIC-STROKE - ASUNDEXIAN 50 MG OR PLACEBO TABLET (PI-SETHI): 1.0000 | ORAL_TABLET | Freq: Every day | ORAL | 0 refills | Status: DC

## 2023-03-25 MED ORDER — METOPROLOL SUCCINATE ER 25 MG PO TB24: 12.5000 mg | ORAL_TABLET | Freq: Every evening | ORAL | 3 refills | Status: AC

## 2023-03-25 MED ORDER — ASPIRIN 81 MG PO TBEC: 81.0000 mg | DELAYED_RELEASE_TABLET | Freq: Every day | ORAL | 12 refills | Status: AC

## 2023-03-25 MED ORDER — LISINOPRIL 30 MG PO TABS: 30.0000 mg | ORAL_TABLET | Freq: Every day | ORAL | 3 refills | Status: AC

## 2023-03-25 MED ORDER — ROSUVASTATIN CALCIUM 40 MG PO TABS: 40.0000 mg | ORAL_TABLET | Freq: Every day | ORAL | 3 refills | Status: AC

## 2023-03-25 NOTE — Progress Notes (Signed)
DC summary reviewed with patient and daughter. Clinical trial medication received form the pharmacy and handed over to the patient.  Pink sheet given to the patient and white sheet left in the chart. Daughter at bedside to take the patient home.

## 2023-03-25 NOTE — Discharge Summary (Signed)
Name: Stephanie Tyler MRN: [id number] DOB: February 17, 1966 57 y.o. PCP: System, Provider Not In  Date of Admission: 03/23/2023  4:39 PM Date of Discharge: 03/25/2023 5:44 PM Attending Physician: No att. providers found  Discharge diagnosis: Active Problems:   HLD (hyperlipidemia)   Human immunodeficiency virus (HIV) disease (HCC)   OSA on CPAP   Essential hypertension   HFrEF (heart failure with reduced ejection fraction) (HCC)  Principal Problem (Resolved):   Acute cerebral infarction [facility])   Discharge medications: Allergies as of 03/25/2023       Reactions   Losartan Potassium Hives   Elemental Sulfur Other (See Comments)   Sulfamethoxazole Other (See Comments)   Unknown to pt        Medication List     STOP taking these medications    pravastatin 40 MG tablet Commonly known as: PRAVACHOL       TAKE these medications    albuterol 108 (90 Base) MCG/ACT inhaler Commonly known as: VENTOLIN HFA Inhale 1 puff into the lungs every 6 (six) hours as needed for wheezing or shortness of breath.   aspirin EC 81 MG tablet Take 1 tablet (81 mg total) by mouth daily. Swallow whole.   budesonide-formoterol 160-4.5 MCG/ACT inhaler Commonly known as: SYMBICORT Inhale 2 puffs into the lungs 2 (two) times daily.   citalopram 40 MG tablet Commonly known as: CELEXA Take 40 mg by mouth daily.   clopidogrel 75 MG tablet Commonly known as: PLAVIX Take 1 tablet (75 mg total) by mouth daily.   dolutegravir-rilpivirine 50-25 MG tablet Commonly known as: JULUCA Take 1 tablet by mouth daily before lunch.   DULoxetine 60 MG capsule Commonly known as: CYMBALTA Take 60 mg by mouth daily.   gabapentin 600 MG tablet Commonly known as: NEURONTIN Take 2 tablets by mouth 2 (two) times daily.   hydrOXYzine 25 MG tablet Commonly known as: ATARAX Take 25 mg by mouth daily.   levothyroxine 100 MCG tablet Commonly known as: SYNTHROID Take 100 mcg by mouth daily before  breakfast.   lisinopril 30 MG tablet Commonly known as: ZESTRIL Take 1 tablet (30 mg total) by mouth daily. What changed:  medication strength how much to take   metoprolol succinate 25 MG 24 hr tablet Commonly known as: TOPROL-XL Take 0.5 tablets (12.5 mg total) by mouth at bedtime.   metroNIDAZOLE 250 MG tablet Commonly known as: FLAGYL Take 250 mg by mouth 3 (three) times daily.   OCEANIC-STROKE asundexian or placebo 50 mg tablet Take 1 tablet (50 mg total) by mouth daily.   ondansetron 4 MG disintegrating tablet Commonly known as: Zofran ODT Take 1 tablet (4 mg total) by mouth every 8 (eight) hours as needed for nausea or vomiting.   rosuvastatin 40 MG tablet Commonly known as: CRESTOR Take 1 tablet (40 mg total) by mouth daily.   Xiidra 5 % Soln Generic drug: Lifitegrast Place 1 drop into both eyes 2 (two) times daily.        Follow-up appointments:  Follow-up Information     [facility] Follow up.   Contact information: [address] [place] [address] [phone]        [facility] .   Contact information: [address] [place] [address] [phone]        Yates Decamp, MD Follow up.   Specialty: Cardiology Contact information: [address] [place] [address] [phone]         [facility],  [facility] Follow up.   Contact information: [address] [place] [address] [phone]         [facility] Follow up.   Contact information: [address] [place], [address] [place] [address] [phone]        [facility] Follow up.   Specialty: Rehabilitation Why: They will call you to schedule therapy Contact information: [address] [facility] [facility] [place] [address] [phone]                Disposition and  recommendations: [name] is a 57 y.o. year old admitted for acute cerebral infarction.  Discharged on hospital day 0 in good condition to home.  Acute cerebral infarction Cryptogenic embolic stroke versus small vessel disease, favor small vessel disease. - Aspirin 81 mg daily indefinitely - Plavix 75 mg daily for 3 weeks - Rosuvastatin 40 mg daily - Enrolled in [place] stroke prevention study with Dr. Pearlean Brownie  Heart failure with reduced ejection fraction EF 35 to 40%.  NYHA class I, asymptomatic.  Incidental finding during workup for stroke.  Deferred sacubitril/valsartan because of lichen planus reaction to losartan previously. - Increase lisinopril to 30 mg daily - Start metoprolol succinate 12.5 mg nightly - Recommend following up with cardiology  Unresulted Labs (From admission, onward)     Start     Ordered   03/25/23 1342  ANA w/Reflex if Positive  Once,   R        03/25/23 1341   03/25/23 1342  Cardiolipin antibodies, IgG, IgM, IgA  Once,   R        03/25/23 1341           Hospital course: Active Problems:   HLD (hyperlipidemia)   Human immunodeficiency virus (HIV) disease (HCC)   OSA on CPAP   Essential hypertension   HFrEF (heart failure with reduced ejection fraction) (HCC)  Principal Problem (Resolved):   Acute cerebral infarction (HCC)  Acute cerebral infarction Presented with left arm weakness and numbness and was found to have right MCA infarcts, query cryptogenic embolic stroke for small vessel disease.  Preponderance of risk factors supports embolic disease.  Her primary deficit is sensorium changes on the left side of her face, arm, and leg.  She reports almost complete return of strength on the left side and does not have discernible deficits in strength on neurologic exam.  She was discharged on dual antiplatelet therapy and a referral to outpatient physical therapy.  Heart failure with reduced ejection fraction Incidental finding on echo for  workup of cerebral infarction.  No symptoms.  Talked about following up with cardiology outside of the hospital and started metoprolol succinate and increased outpatient lisinopril.  HIV Well-controlled, undetectable for decades.  A risk factor for the development of ASCVD.  Discharge exam:  Feels well today, weakness is improving.  Still frustrated by sensory deficits on the left.  Worried about this new diagnosis of heart failure.  Blood pressure (!) 127/96, pulse 77, temperature 98.3 F (36.8 C), temperature source Oral, resp. rate 17, height 5\' 1"  (1.549 m), weight 92.5 kg, SpO2 100%.  No distress Heart rate and rhythm are normal, radial pulses strong Breathing comfortably on room air Skin is warm and dry Alert, subtle ptosis on the left relative to the right, speech sounds normal, no discernible strength deficits in cranial nerves, left upper or left lower extremity, sensory deficit remains present over left arm  Pertinent studies and procedures: Recent Results (from the past 16109 hours)  ECHOCARDIOGRAM COMPLETE   Collection Time: 03/24/23 12:24 PM  Result Value   Weight 3,328   Height 61   BP 149/98   Single Plane A2C EF 46.6   Single Plane A4C EF 25.8   Calc EF 38.9   S' Lateral 4.00   Est EF 35 - 40%   Narrative      ECHOCARDIOGRAM REPORT       Patient Name:   Patria Mane Date of Exam: 03/24/2023 Medical Rec #:  [id number]       Height:       61.0 in Accession #:    [id number]      Weight:       208.0 lb Date of Birth:  Dec 09, 1965       BSA:          1.921 m Patient Age:    57 years        BP:           149/98 mmHg Patient Gender: F               HR:           78 bpm. Exam Location:  Inpatient  Procedure: 2D Echo, Cardiac Doppler and Color Doppler  Indications:    stroke   History:        Patient has no prior history of Echocardiogram examinations.                 Risk Factors:Hypertension.   Sonographer:    Melissa Morford RDCS (AE, PE) Referring Phys:  [id number] Marquita Palms VINCENT  IMPRESSIONS    1. Left ventricular ejection fraction, by estimation, is 35 to 40%. The left ventricle has moderately decreased function. The left ventricle demonstrates regional wall motion abnormalities (see scoring diagram/findings for description). Left ventricular  diastolic parameters are indeterminate. Suggestive of either LAD disease or stress cardiomyopathy.  2. Right ventricular systolic function is normal. The right ventricular size is normal.  3. The mitral valve is normal in structure. No evidence of mitral valve regurgitation. No evidence of mitral stenosis.  4. The aortic valve is tricuspid. Aortic valve regurgitation is not visualized. No aortic stenosis is present.  5. The inferior vena cava is normal in size with greater than 50% respiratory variability, suggesting right atrial pressure of 3 mmHg.  Comparison(s): No prior Echocardiogram.  FINDINGS  Left Ventricle: Left ventricular ejection fraction, by estimation, is 35 to 40%. The left ventricle has moderately decreased function. The left ventricle demonstrates regional wall motion abnormalities. The left ventricular internal cavity size was  normal in size. There is no left ventricular hypertrophy. Left ventricular diastolic parameters are indeterminate.    LV Wall Scoring: The mid and distal anterior wall, mid and distal lateral wall, mid and distal anterior septum, mid and distal inferior wall, mid anterolateral segment, and mid inferoseptal segment are hypokinetic. The basal anteroseptal segment, basal inferolateral segment, basal anterolateral segment, basal anterior segment, basal inferior segment, and basal inferoseptal segment are normal. Suggestive of either LAD disease or stress cardiomyopathy.  Right Ventricle: The right ventricular size is normal. No increase in right ventricular wall thickness. Right ventricular systolic function is normal.  Left Atrium: Left atrial size was  normal in size.  Right Atrium: Right atrial size was normal in size.  Pericardium: Trivial pericardial effusion is present. Presence of epicardial fat layer.  Mitral Valve: The mitral valve is normal in  structure. No evidence of mitral valve regurgitation. No evidence of mitral valve stenosis.  Tricuspid Valve: The tricuspid valve is normal in structure. Tricuspid valve regurgitation is not demonstrated. No evidence of tricuspid stenosis.  Aortic Valve: The aortic valve is tricuspid. Aortic valve regurgitation is not visualized. No aortic stenosis is present.  Pulmonic Valve: The pulmonic valve was not well visualized. Pulmonic valve regurgitation is not visualized. No evidence of pulmonic stenosis.  Aorta: The aortic root and ascending aorta are structurally normal, with no evidence of dilitation.  Venous: The inferior vena cava is normal in size with greater than 50% respiratory variability, suggesting right atrial pressure of 3 mmHg.  IAS/Shunts: The atrial septum is grossly normal.    LEFT VENTRICLE PLAX 2D LVIDd:         5.20 cm     Diastology LVIDs:         4.00 cm     LV e' medial: 3.70 cm/s LV PW:         1.00 cm LV IVS:        1.00 cm   LV Volumes (MOD) LV vol d, MOD A2C: 98.7 ml LV vol d, MOD A4C: 93.5 ml LV vol s, MOD A2C: 52.7 ml LV vol s, MOD A4C: 69.4 ml LV SV MOD A2C:     46.0 ml LV SV MOD A4C:     93.5 ml LV SV MOD BP:      38.5 ml  RIGHT VENTRICLE RV S prime:     12.10 cm/s TAPSE (M-mode): 1.6 cm  LEFT ATRIUM             Index        RIGHT ATRIUM           Index LA diam:        3.20 cm 1.67 cm/m   RA Area:     10.40 cm LA Vol (A2C):   30.4 ml 15.83 ml/m  RA Volume:   24.90 ml  12.97 ml/m LA Vol (A4C):   57.6 ml 29.99 ml/m LA Biplane Vol: 43.1 ml 22.44 ml/m  AORTIC VALVE LVOT Vmax:   89.40 cm/s LVOT Vmean:  58.100 cm/s LVOT VTI:    0.153 m   AORTA Ao Root diam: 2.70 cm Ao Asc diam:  3.10 cm    SHUNTS Systemic VTI: 0.15 m  Riley Lam MD Electronically signed by Riley Lam MD Signature Date/Time: 03/24/2023/3:22:49 PM       Final     *Note: Due to a large number of results and/or encounters for the requested time period, some results have not been displayed. A complete set of results can be found in Results Review.   Imaging Orders         CT ANGIO HEAD NECK W WO CM         MR BRAIN WO CONTRAST         MR Cervical Spine Wo Contrast         VAS [place] TRANSCRANIAL DOPPLER W BUBBLES    Lab Orders         Protime-INR         APTT         CBC         Differential         Comprehensive metabolic panel         Ethanol         Creatinine, serum         Hemoglobin  A1c         Basic metabolic panel         CBC         Lipid panel         Rapid urine drug screen (hospital performed)         Glucose, capillary         Glucose, capillary         ANA w/Reflex if Positive         Cardiolipin antibodies, IgG, IgM, IgA         CBG monitoring, ED     Discharge Instructions:   Discharge Instructions      To [name] or their caretakers,  They were admitted to [facility] on 03/23/2023 for evaluation and treatment of:  Active Problems:   HLD (hyperlipidemia)   Human immunodeficiency virus (HIV) disease (HCC)   OSA on CPAP   Essential hypertension   HFrEF (heart failure with reduced ejection fraction) (HCC)  Principal Problem (Resolved):   Acute cerebral infarction [facility])  The evaluation suggested stroke on the right side of the brain causing weakness and numbness on the left side of the body. They were treated supportive care and medicines to reduce the risk of another stroke.  The results of a heart scan also showed reduced heart pumping function.  They were discharged from the hospital on 03/25/23. I recommend the following after leaving the hospital:   Active Problems:   HLD (hyperlipidemia)   Human immunodeficiency virus (HIV) disease (HCC)   OSA on  CPAP   Essential hypertension   HFrEF (heart failure with reduced ejection fraction) (HCC)  Principal Problem (Resolved):   Acute cerebral infarction (HCC)  To reduce her risk of another stroke, start taking aspirin daily, indefinitely.  Start taking Plavix daily.  You can stop this on January 11.  Start taking rosuvastatin daily.  You can stop pravastatin.  To improve your heart health, increase lisinopril to 30 mg daily.  Start taking metoprolol, 1/2 tablet, once a day at bedtime. Talk to your dermatologist about starting Entresto, which is similar to losartan.  Follow-up with your primary doctor soon as possible after leaving the hospital.  You should see a cardiologist for your heart function.  You should follow-up with a neurologist as well.  For questions about your care plan, until you are able to see your primary doctor: Call [phone]. Dial 0 for the operator. Ask for the internal medicine resident on call.  Marrianne Mood MD 03/25/2023, 12:51 PM      Marrianne Mood MD 03/25/2023, 5:44 PM

## 2023-03-25 NOTE — Progress Notes (Signed)
STROKE TEAM PROGRESS NOTE   BRIEF HPI [name] is a 57 y.o. female with history of HIV, hypothyroidism, Graves' disease, fibromyalgia, hyperlipidemia and hypertension presenting with left arm weakness and left hemibody numbness.  MRI brain demonstrated right MCA distribution infarcts  NIH on Admission 2  INTERIM HISTORY/SUBJECTIVE Patient has been hemodynamically stable and afebrile overnight.  She reports that her left-sided numbness has improved somewhat. She has signed consent and [place] stroke [place].  She is no complaints today.  Echocardiogram showed ejection fraction of 35 to 40%.  Primary team is planning to discuss her echo findings with cardiology. TCD bubble study was negative for right-to-left shunt. OBJECTIVE  CBC    Component Value Date/Time   WBC 9.8 03/24/2023 0531   RBC 4.11 03/24/2023 0531   HGB 13.1 03/24/2023 0531   HCT 39.2 03/24/2023 0531   PLT 294 03/24/2023 0531   MCV 95.4 03/24/2023 0531   MCH 31.9 03/24/2023 0531   MCHC 33.4 03/24/2023 0531   RDW 14.3 03/24/2023 0531   LYMPHSABS 2.2 03/23/2023 1627   MONOABS 0.3 03/23/2023 1627   EOSABS 0.2 03/23/2023 1627   BASOSABS 0.0 03/23/2023 1627    BMET    Component Value Date/Time   NA 140 03/24/2023 0531   K 3.7 03/24/2023 0531   CL 105 03/24/2023 0531   CO2 27 03/24/2023 0531   GLUCOSE 122 (H) 03/24/2023 0531   BUN 7 03/24/2023 0531   CREATININE 0.97 03/24/2023 0531   CALCIUM 9.1 03/24/2023 0531   GFRNONAA >60 03/24/2023 0531    IMAGING past 24 hours No results found.   Vitals:   03/25/23 0500 03/25/23 0531 03/25/23 0742 03/25/23 1139  BP:   (!) 150/107 (!) 127/96  Pulse:   85 77  Resp:   16 17  Temp:   98.4 F (36.9 C) 98.3 F (36.8 C)  TempSrc:   Oral Oral  SpO2:   99% 100%  Weight: 92.6 kg 92.5 kg    Height:         PHYSICAL EXAM General:  Alert, mildly obese pleasant middle-aged African-American lady in no acute distress .right eye proptosis  leading to left eye mild pseudoptosis Psych:  Mood and affect appropriate for situation CV: Regular rate and rhythm on monitor Respiratory:  Regular, unlabored respirations on room air  NEURO:  Mental Status: AA&Ox3, patient is able to give clear and coherent history Speech/Language: speech is without dysarthria or aphasia.    Cranial Nerves:  II: PERRL.  III, IV, VI: EOMI. right eye proptosis left eyelid droop, chronic status post surgery V: Sensation is intact to light touch and diminished on the left VII: Slight left-sided facial droop VIII: hearing intact to voice. IX, X: Phonation is normal.  XII: tongue is midline without fasciculations. Motor: Able to move all 4 extremities with good antigravity strength, diminished fine finger movements on the left.  Orbits right over left upper extremity. Tone: is normal and bulk is normal Sensation-intact to light touch bilaterally but diminished to the left forearm, hand and anterior portion of the left shin Coordination: FTN intact bilaterally Gait- deferred  NIHSS 2 Premorbid modified Rankin score 0 ASSESSMENT/PLAN  Acute Ischemic Infarct:  right MCA territory infarcts Etiology: Likely embolic from cryptogenic source CT head No acute abnormality.  CTA head & neck no LVO or hemodynamically significant stenosis MRI small acute infarcts in the right posterior insula and frontal operculum 2D Echo pending LDL 83 HgbA1c 5.6 VTE prophylaxis -SCDs  No antithrombotic prior to admission, now on aspirin 81 mg daily and clopidogrel 75 mg daily for 3 weeks and then aspirin alone. Therapy recommendations:  Pending Disposition: pending  Hypertension Home meds: Lisinopril 20 mg daily Stable Blood Pressure Goal: BP less than 220/110   Hyperlipidemia Home meds: Pravastatin 40 mg daily, changed to rosuvastatin 40 mg daily LDL 83, goal < 70 Continue statin at discharge  Other Stroke Risk Factors Obesity, Body mass index is 38.53 kg/m., BMI  >/= 30 associated with increased stroke risk, recommend weight loss, diet and exercise as appropriate    Other Active Problems HIV infection-continue home [place]     Patient presented with sudden onset of left upper extremity weakness and paresthesias due to embolic right MCA branch infarcts of cryptogenic etiology.  Echocardiogram shows decreased ejection fraction.  Await cardiology opinion.  Continue cardiac monitoring.  Will need 30-day cardiac monitoring at discharge to look for paroxysmal A-fib.     Aspirin and Plavix for 3 weeks followed by aspirin alone and aggressive risk factor modification.  Check ANA panel and anticardiolipin antibodies. Patient is participating in in the [facility]  stroke prevention trial(standard of care antiplatelet therapy with and without Asundexian - factor XI inhibitor.)   no family evaluate the bedside for discussion. Greater than 50% time during this 35-minute visit was spent on counseling and coordination of care about her cryptogenic stroke and discussion about stroke evaluation and prevention and treatment.  Delia Heady, MD Medical Director [facility] Pager: [phone] 03/25/2023 1:38 PM   To contact Stroke Continuity provider, please refer to [email or web address]. After hours, contact General Neurology

## 2023-03-25 NOTE — TOC Transition Note (Signed)
Transition of Care [facility]) - Discharge Note   Patient Details  Name: Stephanie Tyler MRN: [id number] Date of Birth: [birth year]  [facility]) CM/SW Contact:  Lawerance Sabal, RN Phone Number: 03/25/2023, 1:52 PM   Clinical Narrative:     Sherron Monday w patient over the phone and she would like referral to [facility], referral placed in Epic.         Patient Goals and CMS Choice            Discharge Placement                       Discharge Plan and Services Additional resources added to the After Visit Summary for                                       Social Drivers of Health (SDOH) Interventions SDOH Screenings   Food Insecurity: No Food Insecurity (03/24/2023)  Housing: Low Risk  (02/28/2023)   Received from [facility]  Transportation Needs: No Transportation Needs (03/24/2023)  Utilities: Not At Risk (03/24/2023)  Social Connections: Unknown (08/14/2021)   Received from [facility]  Tobacco Use: Medium Risk (03/23/2023)     Readmission Risk Interventions     No data to display

## 2023-03-25 NOTE — Plan of Care (Signed)
  Problem: Clinical Measurements: Goal: Cardiovascular complication will be avoided Outcome: Not Progressing   Problem: Pain Management: Goal: General experience of comfort will improve Outcome: Not Progressing   Problem: Safety: Goal: Ability to remain free from injury will improve Outcome: Not Progressing

## 2023-03-26 LAB — ANA W/REFLEX IF POSITIVE: Anti Nuclear Antibody (ANA): NEGATIVE

## 2023-03-27 LAB — CARDIOLIPIN ANTIBODIES, IGG, IGM, IGA
Anticardiolipin IgA: <9 [APL'U]/mL (ref 0–11)
Anticardiolipin IgG: <9 [GPL'U]/mL (ref 0–14)
Anticardiolipin IgM: <9 [MPL'U]/mL (ref 0–12)

## 2023-03-29 ENCOUNTER — Other Ambulatory Visit (HOSPITAL_COMMUNITY): Payer: Self-pay

## 2023-03-29 MED ORDER — STUDY - OCEANIC-STROKE - ASUNDEXIAN 50 MG OR PLACEBO TABLET (PI-SETHI): 1.0000 | ORAL_TABLET | Freq: Every day | ORAL | 0 refills | Status: DC

## 2023-03-30 NOTE — Progress Notes (Signed)
Kindly inform the patient that lab work for antiphospholipid antibodies was normal.

## 2023-04-03 ENCOUNTER — Telehealth: Payer: Self-pay

## 2023-04-03 NOTE — Telephone Encounter (Signed)
-----   Message from Stephanie Tyler sent at 03/30/2023  8:15 AM EST ----- Kindly inform the patient that lab work for antiphospholipid antibodies was normal.

## 2023-04-03 NOTE — Telephone Encounter (Signed)
Lmtrc

## 2023-04-05 NOTE — Telephone Encounter (Signed)
 Pt returned call and was given the results. Pt verbalized appreciation.

## 2023-04-05 NOTE — Telephone Encounter (Signed)
Contacted pt again, LVM rq cal lback.  

## 2023-04-05 NOTE — Telephone Encounter (Signed)
 Noted, thank you

## 2023-06-19 ENCOUNTER — Other Ambulatory Visit: Payer: Self-pay | Admitting: Student

## 2023-06-19 NOTE — Telephone Encounter (Signed)
 NOT [facility] PATIENT

## 2023-06-20 ENCOUNTER — Other Ambulatory Visit (HOSPITAL_COMMUNITY): Payer: Self-pay | Admitting: Pharmacist

## 2023-06-20 ENCOUNTER — Other Ambulatory Visit: Payer: Self-pay | Admitting: Student

## 2023-06-20 MED ORDER — STUDY - OCEANIC-STROKE - ASUNDEXIAN 50 MG OR PLACEBO TABLET (PI-SETHI)
1.0000 | ORAL_TABLET | Freq: Every day | ORAL | 0 refills | Status: DC
Start: 2023-06-20 — End: 2023-09-24

## 2023-06-20 NOTE — Telephone Encounter (Signed)
 NOT [facility] PATIENT

## 2023-09-22 IMAGING — DX DG FOOT COMPLETE 3+V*L*
3 series · 3 of 3 positions shown · non-contrast
Comparison: Foot radiograph 03/16/2019

CLINICAL DATA: Twisted foot and ankle walking outside 2 days ago.

EXAM:
LEFT FOOT - COMPLETE 3+ VIEW

[foot ap]
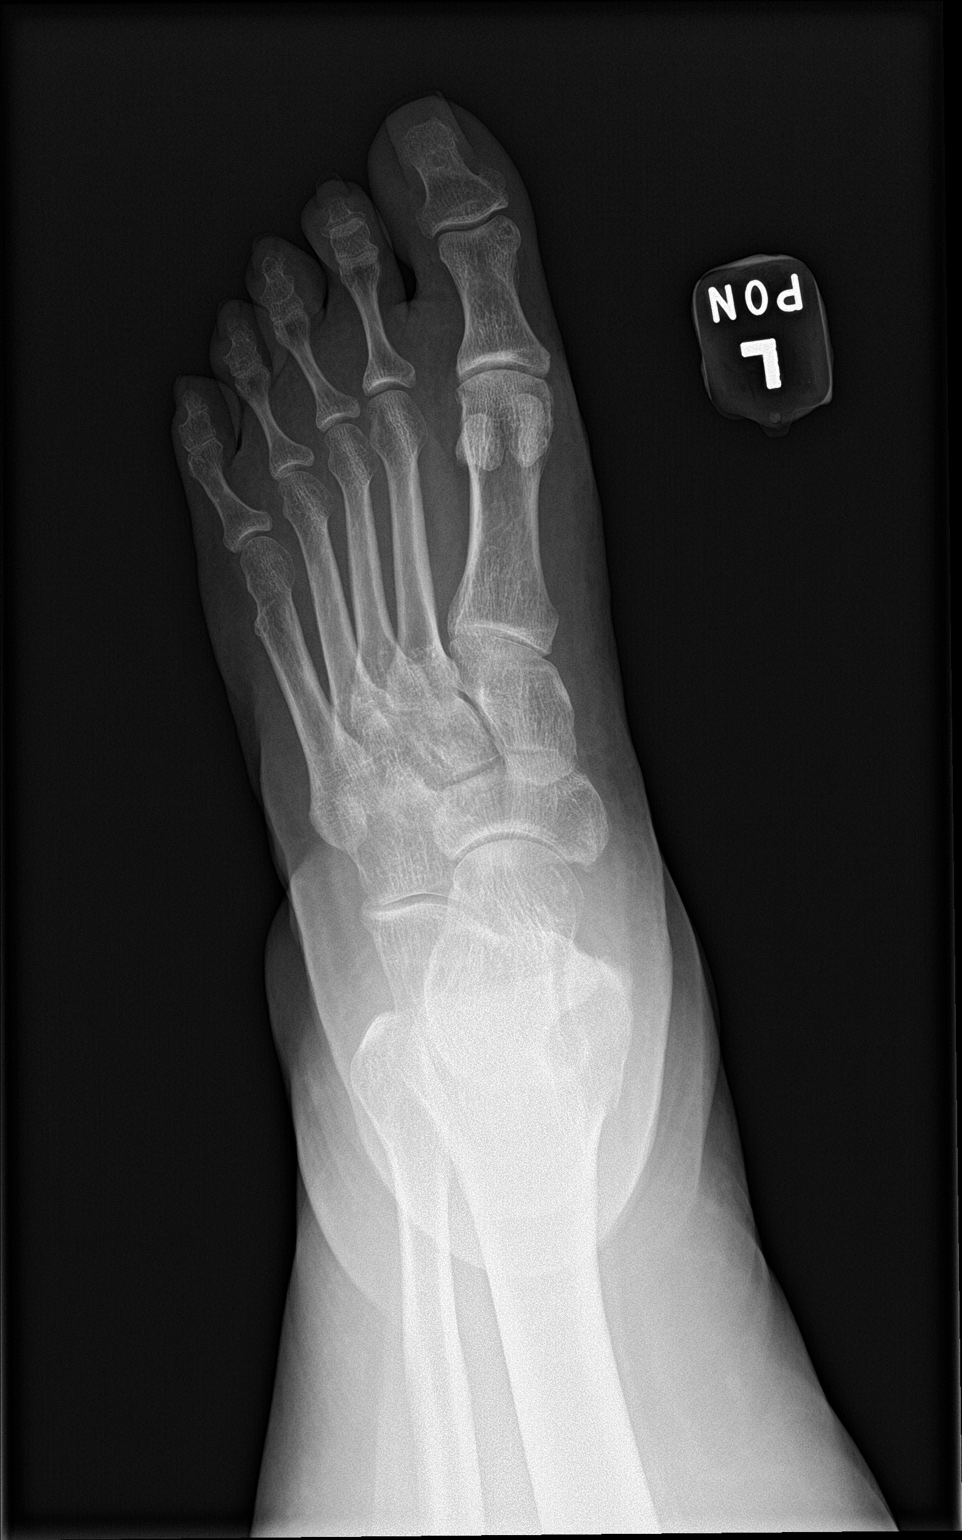

[foot obl]
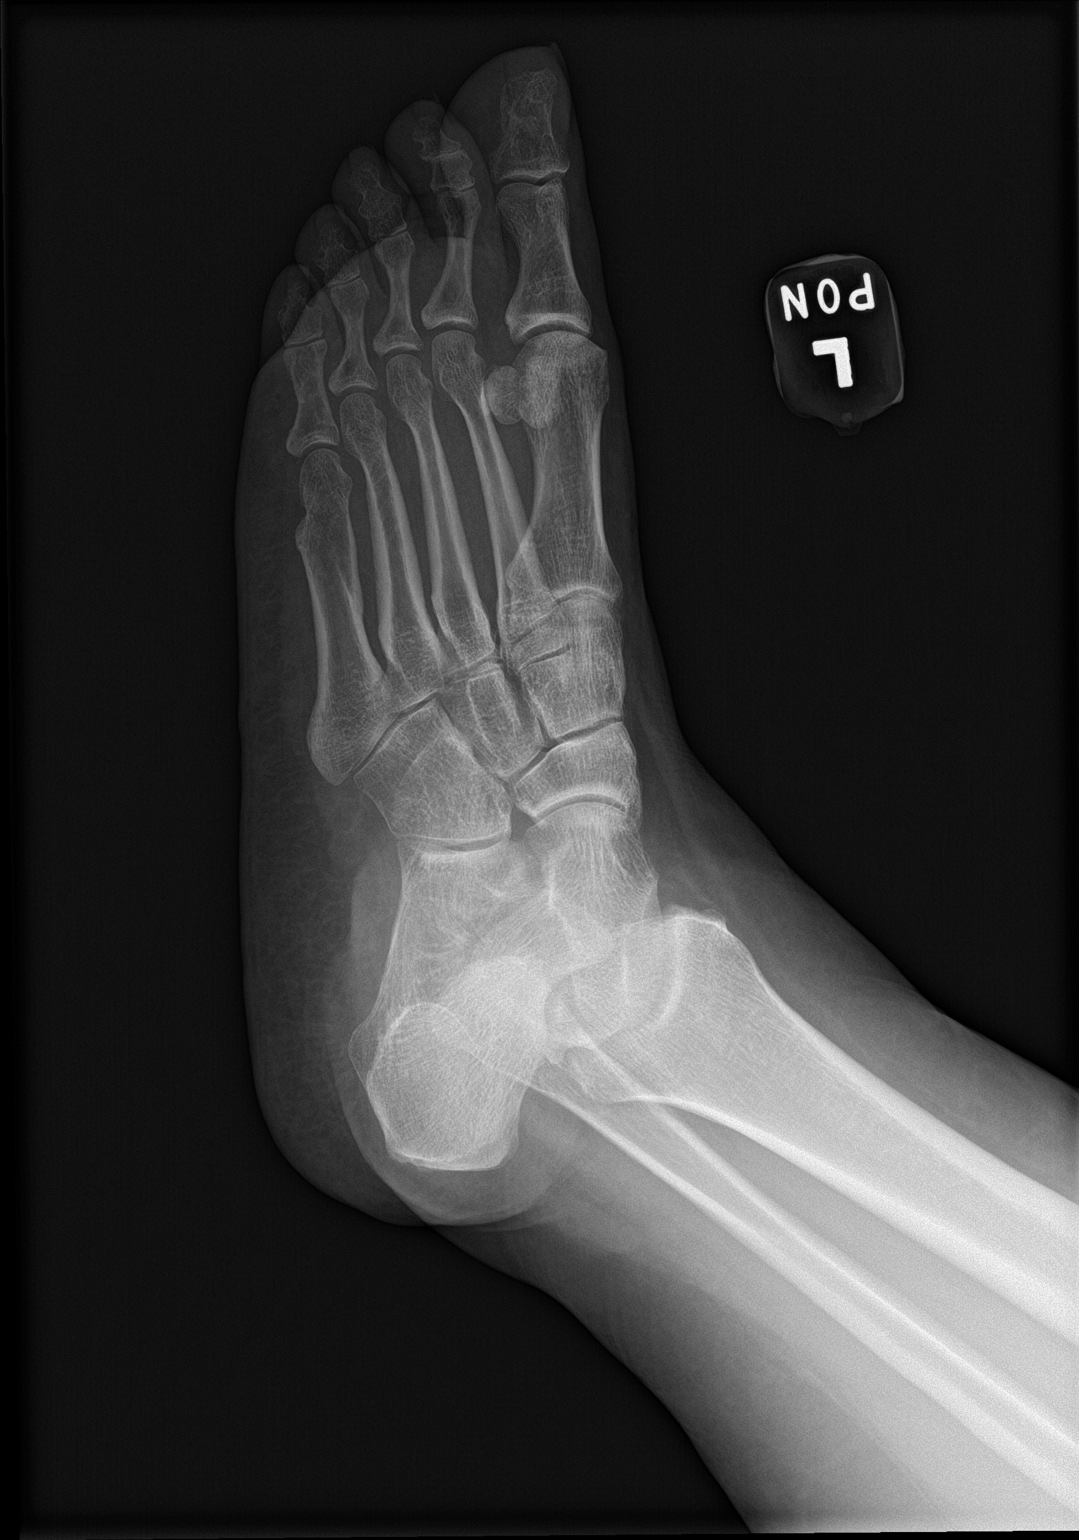

[foot lat]
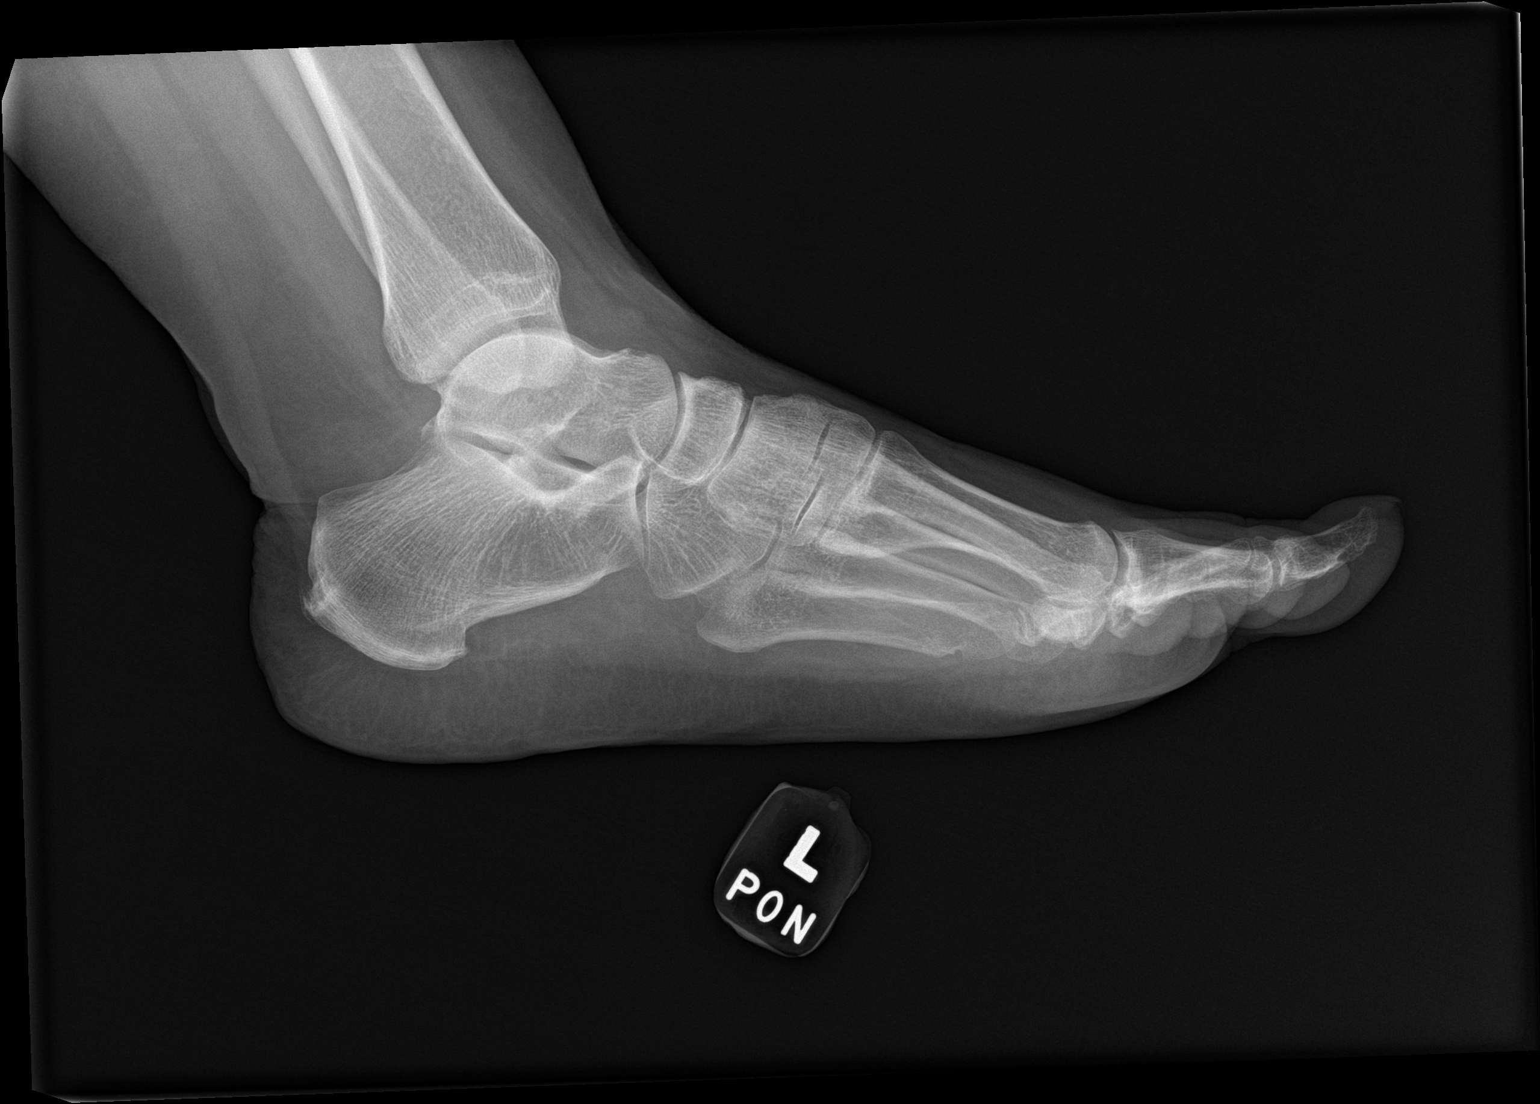

[3 of 3 positions shown; findings below may reference images not displayed]

FINDINGS: No acute fracture. Remote healed fracture of the fifth metatarsal
shaft and fifth proximal phalanx. Normal foot alignment. Trace
spurring of the first metatarsal phalangeal joint and dorsal
midfoot. Small plantar calcaneal spur and Achilles tendon
enthesophyte. No focal soft tissue abnormality.
IMPRESSION: 1. No acute fracture or subluxation of the left foot.
2. Remote healed fifth metatarsal and proximal phalanx fractures.

## 2023-09-24 ENCOUNTER — Other Ambulatory Visit (HOSPITAL_COMMUNITY): Payer: Self-pay | Admitting: Neurology

## 2023-09-24 MED ORDER — STUDY - OCEANIC-STROKE - ASUNDEXIAN 50 MG OR PLACEBO TABLET (PI-SETHI): 1.0000 | ORAL_TABLET | Freq: Every day | ORAL | 0 refills | Status: AC

## 2024-01-28 ENCOUNTER — Emergency Department (HOSPITAL_BASED_OUTPATIENT_CLINIC_OR_DEPARTMENT_OTHER)
Admission: EM | Admit: 2024-01-28 | Discharge: 2024-01-28 | Disposition: A | Discharge status: Home / Self Care | Attending: Emergency Medicine | Admitting: Emergency Medicine

## 2024-01-28 ENCOUNTER — Encounter (HOSPITAL_BASED_OUTPATIENT_CLINIC_OR_DEPARTMENT_OTHER): Payer: Self-pay

## 2024-01-28 ENCOUNTER — Other Ambulatory Visit: Payer: Self-pay

## 2024-01-28 DIAGNOSIS — Z7982 Long term (current) use of aspirin: Secondary | ICD-10-CM | POA: Diagnosis not present

## 2024-01-28 DIAGNOSIS — M5431 Sciatica, right side: Secondary | ICD-10-CM

## 2024-01-28 DIAGNOSIS — M545 Low back pain, unspecified: Secondary | ICD-10-CM | POA: Diagnosis present

## 2024-01-28 DIAGNOSIS — Z7901 Long term (current) use of anticoagulants: Secondary | ICD-10-CM | POA: Diagnosis not present

## 2024-01-28 DIAGNOSIS — M5441 Lumbago with sciatica, right side: Secondary | ICD-10-CM | POA: Diagnosis not present

## 2024-01-28 DIAGNOSIS — Z21 Asymptomatic human immunodeficiency virus [HIV] infection status: Secondary | ICD-10-CM | POA: Diagnosis not present

## 2024-01-28 MED ORDER — KETOROLAC TROMETHAMINE 15 MG/ML IJ SOLN
15.0000 mg | Freq: Once | INTRAMUSCULAR | Status: AC
  Administered 2024-01-28: 15 mg via INTRAMUSCULAR
  Filled 2024-01-28: qty 1

## 2024-01-28 MED ORDER — PREDNISONE 10 MG (21) PO TBPK: ORAL_TABLET | Freq: Every day | ORAL | 0 refills | Status: AC

## 2024-01-28 MED ORDER — PREDNISONE 50 MG PO TABS
60.0000 mg | ORAL_TABLET | Freq: Once | ORAL | Status: AC
  Administered 2024-01-28: 60 mg via ORAL
  Filled 2024-01-28: qty 1

## 2024-01-28 NOTE — ED Provider Notes (Signed)
 [place] [facility] AT [facility] Provider Note   CSN: [id number] Arrival date & time: 01/28/24  1702     Patient presents with: Leg Pain   Stephanie Tyler is a 58 y.o. female who presents with complaints of low back pain.  Describes radicular symptoms down her right lower extremity.  Pain is worse with movement.  Denies any injury or trauma.  No prior spinal surgeries.  No urinary or fecal incontinence.  No IV drug use or history of malignancy.    Leg Pain     Past Medical History:  Diagnosis Date   Diverticulitis    Fibromyalgia    HIV (human immunodeficiency virus infection) (HCC)    Thyroid disease    Past Surgical History:  Procedure Laterality Date   COLON SURGERY     EYE SURGERY     OVARIAN CYST REMOVAL       Prior to Admission medications   Medication Sig Start Date End Date Taking? Authorizing Provider  predniSONE (STERAPRED UNI-PAK 21 TAB) 10 MG (21) TBPK tablet Take by mouth daily. Take 6 tabs by mouth daily  for 2 days, then 5 tabs for 2 days, then 4 tabs for 2 days, then 3 tabs for 2 days, 2 tabs for 2 days, then 1 tab by mouth daily for 2 days 01/28/24  Yes Donnajean Lynwood DEL, PA-C  albuterol  (VENTOLIN  HFA) 108 (90 Base) MCG/ACT inhaler Inhale 1 puff into the lungs every 6 (six) hours as needed for wheezing or shortness of breath.    [provider]  aspirin  EC 81 MG tablet Take 1 tablet (81 mg total) by mouth daily. Swallow whole. 03/25/23   Norrine Sharper, MD  budesonide-formoterol (SYMBICORT) 160-4.5 MCG/ACT inhaler Inhale 2 puffs into the lungs 2 (two) times daily.    [provider]  citalopram  (CELEXA ) 40 MG tablet Take 40 mg by mouth daily.    [provider]  clopidogrel  (PLAVIX ) 75 MG tablet Take 1 tablet (75 mg total) by mouth daily. 03/25/23   Norrine Sharper, MD  dolutegravir -rilpivirine  (JULUCA ) 50-25 MG tablet Take 1 tablet by mouth daily before lunch.    [provider]   DULoxetine (CYMBALTA) 60 MG capsule Take 60 mg by mouth daily.    [provider]  gabapentin  (NEURONTIN ) 600 MG tablet Take 2 tablets by mouth 2 (two) times daily. 06/26/18   [provider]  hydrOXYzine  (ATARAX ) 25 MG tablet Take 25 mg by mouth daily.    [provider]  levothyroxine  (SYNTHROID ) 100 MCG tablet Take 100 mcg by mouth daily before breakfast.    [provider]  Lifitegrast (XIIDRA) 5 % SOLN Place 1 drop into both eyes 2 (two) times daily.    [provider]  lisinopril  (ZESTRIL ) 30 MG tablet Take 1 tablet (30 mg total) by mouth daily. 03/25/23   Norrine Sharper, MD  metoprolol  succinate (TOPROL -XL) 25 MG 24 hr tablet Take 0.5 tablets (12.5 mg total) by mouth at bedtime. 03/25/23   Norrine Sharper, MD  metroNIDAZOLE  (FLAGYL ) 250 MG tablet Take 250 mg by mouth 3 (three) times daily.    [provider]  ondansetron  (ZOFRAN  ODT) 4 MG disintegrating tablet Take 1 tablet (4 mg total) by mouth every 8 (eight) hours as needed for nausea or vomiting. 08/29/20   Shepard, Margaux, PA-C  rosuvastatin  (CRESTOR ) 40 MG tablet Take 1 tablet (40 mg total) by mouth daily. 03/25/23   Norrine Sharper, MD  Study - OCEANIC-STROKE - asundexian 50  mg or placebo tablet (PI-Sethi) Take 1 tablet by mouth daily. Investigational Study Drug - contact the [facility] for questions or concerns    Rosemarie Eather RAMAN, MD  Study - OCEANIC-STROKE - asundexian 50 mg or placebo tablet (PI-Sethi) Take 1 tablet (50 mg total) by mouth daily. For Investigational Use Only. Take 1 tablet by mouth once daily at the same time each day, preferably in the morning. Please bring bottle to every visit. 09/24/23   Rosemarie Eather RAMAN, MD    Allergies: Losartan potassium, Elemental sulfur, and Sulfamethoxazole    Review of Systems  Musculoskeletal:  Positive for myalgias.    Updated Vital Signs BP 120/71 (BP Location: Right Arm)   Pulse 69   Temp 98.3 F  (36.8 C) (Oral)   Resp 18   Ht 5' 2 (1.575 m)   Wt 101.6 kg   SpO2 98%   BMI 40.97 kg/m   Physical Exam Vitals and nursing note reviewed.  Constitutional:      General: She is not in acute distress.    Appearance: She is well-developed.  HENT:     Head: Normocephalic and atraumatic.  Eyes:     Conjunctiva/sclera: Conjunctivae normal.  Cardiovascular:     Rate and Rhythm: Normal rate and regular rhythm.     Heart sounds: No murmur heard. Pulmonary:     Effort: Pulmonary effort is normal. No respiratory distress.     Breath sounds: Normal breath sounds.  Musculoskeletal:        General: No swelling.     Cervical back: Neck supple.     Comments: Tenderness to right lower lumbar and buttock region, no midline tenderness, 5 out of 5 lower extremity strength, is ambulatory, neurovasc intact  Skin:    General: Skin is warm and dry.     Capillary Refill: Capillary refill takes less than 2 seconds.  Neurological:     Mental Status: She is alert.  Psychiatric:        Mood and Affect: Mood normal.     (all labs ordered are listed, but only abnormal results are displayed) Labs Reviewed - No data to display  EKG: None  Radiology: No results found.   Procedures   Medications Ordered in the ED  ketorolac (TORADOL) 15 MG/ML injection 15 mg (15 mg Intramuscular Given 01/28/24 1807)  predniSONE (DELTASONE) tablet 60 mg (60 mg Oral Given 01/28/24 1806)    Clinical Course as of 01/28/24 1816  Mon Jan 28, 2024  1813 Patient evaluated for complaints of atraumatic back pain with right lower extremity radicular symptoms over the past few days.  She is hemodynamically stable.  On exam she has tenderness to the right lower lumbar and buttock region without midline tenderness.  She has 5 out of 5 strength she is neuro vastly intact.  She is ambulatory.  No red flag symptoms.  Do not feel that any advanced imaging or urgent neurosurgery consult is indicated today.  Will provide Ortho  follow-up in addition to a prescription for prednisone taper.  Will provide dose of Toradol here in the ED.  Strict return precautions provided.  Patient is understanding agreement plan. [JT]    Clinical Course User Index [JT] Donnajean Lynwood DEL, PA-C                                 Medical Decision Making  This patient presents to the ED with  chief complaint(s) of back pain.  The complaint involves an extensive differential diagnosis and also carries with it a high risk of complications and morbidity.   Pertinent past medical history as listed in HPI  The differential diagnosis includes  Do not suspect spinal abscess, cauda equina patient off exam and history.  There was no trauma to be concerned about fracture or dislocation. Additional history obtained: Records reviewed Care Everywhere/External Records  Disposition:   Patient will be discharged home. The patient has been appropriately medically screened and/or stabilized in the ED. I have low suspicion for any other emergent medical condition which would require further screening, evaluation or treatment in the ED or require inpatient management. At time of discharge the patient is hemodynamically stable and in no acute distress. I have discussed work-up results and diagnosis with patient and answered all questions. Patient is agreeable with discharge plan. We discussed strict return precautions for returning to the emergency department and they verbalized understanding.     Social Determinants of Health:   none  This note was dictated with voice recognition software.  Despite best efforts at proofreading, errors may have occurred which can change the documentation meaning.       Final diagnoses:  Sciatica of right side    ED Discharge Orders          Ordered    predniSONE (STERAPRED UNI-PAK 21 TAB) 10 MG (21) TBPK tablet  Daily        01/28/24 1815               Donnajean Lynwood DEL, PA-C 01/28/24 1816    Ruthe Cornet, DO 01/28/24 1818

## 2024-01-28 NOTE — ED Notes (Signed)
 ED Provider at bedside.

## 2024-01-28 NOTE — ED Triage Notes (Signed)
 C/o right lumbar back pain radiating to right buttocks and down leg. States leg collapsed on her today. Denies numbness/tingling. Denies injury

## 2024-01-28 NOTE — Discharge Instructions (Signed)
 You were evaluated emergency room for back pain.  Prescription for prednisone taper was sent into your pharmacy.  You may additionally alternate Tylenol  and ibuprofen  throughout the day.  You were provided a referral for orthopedics.  Please call the number that sheet to set up an appointment.

## 2024-04-02 ENCOUNTER — Other Ambulatory Visit: Payer: Self-pay | Admitting: Student

## 2024-04-02 NOTE — Telephone Encounter (Signed)
 NOT [facility] PATIENT
